# Patient Record
Sex: Female | Born: 1980 | Race: White | Hispanic: No | Marital: Single | Smoking: Current every day smoker
Health system: Southern US, Community
[De-identification: ages and names within clinical notes are randomized; demographics above are authoritative.]

## PROBLEM LIST (undated history)

## (undated) DIAGNOSIS — O1490 Unspecified pre-eclampsia, unspecified trimester: Secondary | ICD-10-CM

## (undated) DIAGNOSIS — F419 Anxiety disorder, unspecified: Secondary | ICD-10-CM

## (undated) DIAGNOSIS — H5213 Myopia, bilateral: Secondary | ICD-10-CM

## (undated) DIAGNOSIS — B182 Chronic viral hepatitis C: Secondary | ICD-10-CM

## (undated) DIAGNOSIS — J45909 Unspecified asthma, uncomplicated: Secondary | ICD-10-CM

## (undated) DIAGNOSIS — M5126 Other intervertebral disc displacement, lumbar region: Secondary | ICD-10-CM

## (undated) DIAGNOSIS — F1911 Other psychoactive substance abuse, in remission: Secondary | ICD-10-CM

## (undated) DIAGNOSIS — E0590 Thyrotoxicosis, unspecified without thyrotoxic crisis or storm: Secondary | ICD-10-CM

## (undated) HISTORY — DX: Myopia, bilateral: H52.13

## (undated) HISTORY — DX: Other intervertebral disc displacement, lumbar region: M51.26

## (undated) HISTORY — DX: Anxiety disorder, unspecified: F41.9

## (undated) HISTORY — DX: Other psychoactive substance abuse, in remission: F19.11

## (undated) HISTORY — DX: Chronic viral hepatitis C: B18.2

## (undated) HISTORY — DX: Thyrotoxicosis, unspecified without thyrotoxic crisis or storm: E05.90

## (undated) HISTORY — DX: Unspecified pre-eclampsia, unspecified trimester: O14.90

## (undated) HISTORY — DX: Unspecified asthma, uncomplicated: J45.909

---
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
## 2005-03-28 ENCOUNTER — Emergency Department: Payer: Self-pay | Admitting: Emergency Medicine

## 2006-03-02 ENCOUNTER — Emergency Department: Payer: Self-pay | Admitting: Emergency Medicine

## 2006-03-02 ENCOUNTER — Other Ambulatory Visit: Payer: Self-pay

## 2006-04-10 ENCOUNTER — Observation Stay: Payer: Self-pay | Admitting: Obstetrics and Gynecology

## 2006-08-09 ENCOUNTER — Observation Stay: Payer: Self-pay | Admitting: Obstetrics and Gynecology

## 2006-09-01 ENCOUNTER — Observation Stay: Payer: Self-pay | Admitting: Obstetrics and Gynecology

## 2006-09-02 ENCOUNTER — Inpatient Hospital Stay: Payer: Self-pay | Admitting: Obstetrics and Gynecology

## 2006-11-05 DIAGNOSIS — M5126 Other intervertebral disc displacement, lumbar region: Secondary | ICD-10-CM

## 2006-11-05 HISTORY — DX: Other intervertebral disc displacement, lumbar region: M51.26

## 2007-01-27 ENCOUNTER — Ambulatory Visit: Payer: Self-pay | Admitting: Specialist

## 2008-09-06 ENCOUNTER — Emergency Department: Payer: Self-pay | Admitting: Emergency Medicine

## 2008-11-05 HISTORY — PX: LAPAROSCOPIC CHOLECYSTECTOMY: SUR755

## 2009-02-25 ENCOUNTER — Encounter
Admission: RE | Admit: 2009-02-25 | Discharge: 2009-02-25 | Payer: Self-pay | Admitting: Physical Medicine & Rehabilitation

## 2009-03-19 ENCOUNTER — Emergency Department: Payer: Self-pay | Admitting: Emergency Medicine

## 2009-08-08 ENCOUNTER — Emergency Department: Payer: Self-pay | Admitting: Emergency Medicine

## 2009-10-30 ENCOUNTER — Emergency Department: Payer: Self-pay | Admitting: Unknown Physician Specialty

## 2009-11-06 ENCOUNTER — Emergency Department: Payer: Self-pay | Admitting: Unknown Physician Specialty

## 2009-12-16 ENCOUNTER — Ambulatory Visit: Payer: Self-pay | Admitting: Surgery

## 2009-12-19 ENCOUNTER — Ambulatory Visit: Payer: Self-pay | Admitting: Surgery

## 2010-01-03 ENCOUNTER — Ambulatory Visit: Payer: Self-pay | Admitting: Pain Medicine

## 2010-01-09 ENCOUNTER — Ambulatory Visit: Payer: Self-pay | Admitting: Pain Medicine

## 2010-01-26 ENCOUNTER — Ambulatory Visit: Payer: Self-pay | Admitting: Pain Medicine

## 2010-07-27 ENCOUNTER — Emergency Department (HOSPITAL_COMMUNITY): Admission: EM | Admit: 2010-07-27 | Discharge: 2010-07-27 | Payer: Self-pay | Admitting: Emergency Medicine

## 2010-07-31 ENCOUNTER — Emergency Department: Payer: Self-pay | Admitting: Internal Medicine

## 2010-09-24 ENCOUNTER — Emergency Department: Payer: Self-pay | Admitting: Emergency Medicine

## 2010-11-26 ENCOUNTER — Encounter: Payer: Self-pay | Admitting: Neurosurgery

## 2010-11-29 ENCOUNTER — Emergency Department: Payer: Self-pay | Admitting: Emergency Medicine

## 2011-01-18 LAB — POCT I-STAT, CHEM 8
Calcium, Ion: 1.15 mmol/L (ref 1.12–1.32)
Chloride: 102 mEq/L (ref 96–112)
Creatinine, Ser: 0.9 mg/dL (ref 0.4–1.2)
Glucose, Bld: 69 mg/dL — ABNORMAL LOW (ref 70–99)
HCT: 45 % (ref 36.0–46.0)
Sodium: 141 mEq/L (ref 135–145)

## 2011-01-18 LAB — RAPID URINE DRUG SCREEN, HOSP PERFORMED
Amphetamines: NOT DETECTED
Barbiturates: NOT DETECTED
Benzodiazepines: POSITIVE — AB
Opiates: POSITIVE — AB

## 2011-01-18 LAB — ETHANOL: Alcohol, Ethyl (B): <5 mg/dL (ref 0–10)

## 2013-06-30 ENCOUNTER — Emergency Department: Payer: Self-pay | Admitting: Emergency Medicine

## 2013-08-08 ENCOUNTER — Emergency Department: Payer: Self-pay | Admitting: Emergency Medicine

## 2013-08-25 ENCOUNTER — Emergency Department: Payer: Self-pay | Admitting: Emergency Medicine

## 2014-05-16 ENCOUNTER — Emergency Department: Payer: Self-pay | Admitting: Emergency Medicine

## 2014-12-06 ENCOUNTER — Emergency Department: Payer: Self-pay | Admitting: Student

## 2015-05-01 ENCOUNTER — Emergency Department
Admission: EM | Admit: 2015-05-01 | Discharge: 2015-05-01 | Disposition: A | Discharge status: Home / Self Care | Payer: Medicaid Other | Attending: Emergency Medicine | Admitting: Emergency Medicine

## 2015-05-01 ENCOUNTER — Emergency Department: Payer: Medicaid Other

## 2015-05-01 ENCOUNTER — Encounter: Payer: Self-pay | Admitting: Emergency Medicine

## 2015-05-01 DIAGNOSIS — L02512 Cutaneous abscess of left hand: Secondary | ICD-10-CM | POA: Insufficient documentation

## 2015-05-01 DIAGNOSIS — L0291 Cutaneous abscess, unspecified: Secondary | ICD-10-CM

## 2015-05-01 DIAGNOSIS — Z720 Tobacco use: Secondary | ICD-10-CM | POA: Insufficient documentation

## 2015-05-01 LAB — CBC
HCT: 47.1 % — ABNORMAL HIGH (ref 35.0–47.0)
Hemoglobin: 15.9 g/dL (ref 12.0–16.0)
MCH: 30.4 pg (ref 26.0–34.0)
MCHC: 33.7 g/dL (ref 32.0–36.0)
MCV: 90.1 fL (ref 80.0–100.0)
PLATELETS: 364 10*3/uL (ref 150–440)
RBC: 5.23 MIL/uL — ABNORMAL HIGH (ref 3.80–5.20)
RDW: 13.3 % (ref 11.5–14.5)
WBC: 9.8 10*3/uL (ref 3.6–11.0)

## 2015-05-01 LAB — COMPREHENSIVE METABOLIC PANEL
ALT: 9 U/L — AB (ref 14–54)
ANION GAP: 11 (ref 5–15)
AST: 16 U/L (ref 15–41)
Albumin: 3.5 g/dL (ref 3.5–5.0)
Alkaline Phosphatase: 80 U/L (ref 38–126)
BILIRUBIN TOTAL: 1.2 mg/dL (ref 0.3–1.2)
BUN: <5 mg/dL — ABNORMAL LOW (ref 6–20)
CALCIUM: 8.8 mg/dL — AB (ref 8.9–10.3)
CO2: 26 mmol/L (ref 22–32)
Chloride: 98 mmol/L — ABNORMAL LOW (ref 101–111)
Creatinine, Ser: 0.61 mg/dL (ref 0.44–1.00)
GFR calc Af Amer: >60 mL/min (ref >60)
GFR calc non Af Amer: >60 mL/min (ref >60)
GLUCOSE: 106 mg/dL — AB (ref 65–99)
POTASSIUM: 3.2 mmol/L — AB (ref 3.5–5.1)
Sodium: 135 mmol/L (ref 135–145)
Total Protein: 8 g/dL (ref 6.5–8.1)

## 2015-05-01 MED ORDER — LIDOCAINE-EPINEPHRINE 2 %-1:100000 IJ SOLN
20.0000 mL | Freq: Once | INTRAMUSCULAR | Status: AC
  Administered 2015-05-01: 20 mL via INTRADERMAL

## 2015-05-01 MED ORDER — DIPHENHYDRAMINE HCL 25 MG PO CAPS: 25.0000 mg | ORAL_CAPSULE | Freq: Once | ORAL | Status: DC

## 2015-05-01 MED ORDER — CLINDAMYCIN HCL 150 MG PO CAPS: 300.0000 mg | ORAL_CAPSULE | Freq: Once | ORAL | Status: DC

## 2015-05-01 MED ORDER — CLINDAMYCIN HCL 300 MG PO CAPS: 600.0000 mg | ORAL_CAPSULE | Freq: Three times a day (TID) | ORAL | Status: DC

## 2015-05-01 MED ORDER — DIPHENHYDRAMINE HCL 25 MG PO CAPS
ORAL_CAPSULE | ORAL | Status: AC
  Administered 2015-05-01: 25 mg
  Filled 2015-05-01: qty 1

## 2015-05-01 MED ORDER — CLINDAMYCIN HCL 150 MG PO CAPS
ORAL_CAPSULE | ORAL | Status: AC
  Administered 2015-05-01: 600 mg
  Filled 2015-05-01: qty 4

## 2015-05-01 MED ORDER — LIDOCAINE-EPINEPHRINE (PF) 1 %-1:200000 IJ SOLN
INTRAMUSCULAR | Status: AC
  Filled 2015-05-01: qty 30

## 2015-05-01 MED ORDER — VANCOMYCIN HCL IN DEXTROSE 1-5 GM/200ML-% IV SOLN
1000.0000 mg | Freq: Once | INTRAVENOUS | Status: AC
  Administered 2015-05-01: 1000 mg via INTRAVENOUS
  Filled 2015-05-01: qty 200

## 2015-05-01 NOTE — ED Notes (Signed)
Patient c/o significant itching with redness around IV site, patient also verbalizing that she can not sit and wait for antibiotic to infuse especially since it will take an hour. After consultation with MD order received for oral Benadryl and Clindamycin with instruction that she be very vigilant to have prescription filled and begin taking in the morning. Patient verbalized understanding as well as that she needs to return tomorrow for wound checl.

## 2015-05-01 NOTE — ED Notes (Signed)
Pt complains of abscess to left hand that started 3 days ago. Swelling noted to left hand. Pt states that site is draining and is complaining of pain.

## 2015-05-01 NOTE — ED Notes (Signed)
Patient verbalized need to follow up in ED tomorrow for recheck of left hand as well as obtain and start antibiotics as soon as possible in the morning.

## 2015-05-01 NOTE — ED Provider Notes (Signed)
[facility] Emergency Department Provider Note    ____________________________________________  Time seen: 1630  I have reviewed the triage vital signs and the nursing notes.   HISTORY  Chief Complaint Abscess   History limited by: Not Limited   HPI Penny Tucker is a 34 y.o. female who presents to the emergency department today because of concerns for abscess on left hand. She states it is been there for 3 days. She does admit to IV drug use at that site. She states that she has been poking it with a needle to try to start drainage. She states yesterday she felt like a "core" came out. She has had continued pain redness and swelling to the site. She denies any fevers, vomiting or diarrhea.   History reviewed. No pertinent past medical history.  There are no active problems to display for this patient.   History reviewed. No pertinent past surgical history.  No current outpatient prescriptions on file.  Allergies Review of patient's allergies indicates no known allergies.  History reviewed. No pertinent family history.  Social History History  Substance Use Topics  . Smoking status: Current Every Day Smoker -- 1.00 packs/day    Types: Cigarettes  . Smokeless tobacco: Not on file  . Alcohol Use: No    Review of Systems  Constitutional: Negative for fever. Cardiovascular: Negative for chest pain. Respiratory: Negative for shortness of breath. Gastrointestinal: Negative for abdominal pain, vomiting and diarrhea. Musculoskeletal: Left hand pain, swelling. Skin: Open and draining abscess to left hand. Neurological: Negative for headaches, focal weakness or numbness.   10-point ROS otherwise negative.  ____________________________________________   PHYSICAL EXAM:  VITAL SIGNS: ED Triage Vitals  Enc Vitals Group     BP 05/01/15 1557 109/73 mmHg     Pulse Rate 05/01/15 1557 104     Resp 05/01/15 1557 18     Temp 05/01/15 1557 98.4  F (36.9 C)     Temp Source 05/01/15 1557 Oral     SpO2 05/01/15 1557 97 %     Weight 05/01/15 1557 150 lb (68.04 kg)     Height 05/01/15 1557 5\' 8"  (1.727 m)     Head Cir --      Peak Flow --      Pain Score 05/01/15 1558 10   Constitutional: Alert and oriented. Well appearing and in no distress. Eyes: Conjunctivae are normal. PERRL. Normal extraocular movements. ENT   Head: Normocephalic and atraumatic.   Nose: No congestion/rhinnorhea.   Mouth/Throat: Mucous membranes are moist.   Neck: No stridor. Hematological/Lymphatic/Immunilogical: No cervical lymphadenopathy. Cardiovascular: Normal rate, regular rhythm.   Respiratory: Normal respiratory effort without tachypnea nor retractions.  Gastrointestinal: Soft and nontender. No distention.  Genitourinary: Deferred Musculoskeletal: Left hand with erythema and swelling. Roughly three-quarter centimeter open wound near base of first digit. Exam consistent with draining abscess. Neurologic:  Normal speech and language. No gross focal neurologic deficits are appreciated. Speech is normal.  Skin:  Draining abscess to left hand Psychiatric: Mood and affect are normal. Speech and behavior are normal. Patient exhibits appropriate insight and judgment.  ____________________________________________    LABS (pertinent positives/negatives)  Labs Reviewed  CBC - Abnormal; Notable for the following:    RBC 5.23 (*)    HCT 47.1 (*)    All other components within normal limits  COMPREHENSIVE METABOLIC PANEL - Abnormal; Notable for the following:    Potassium 3.2 (*)    Chloride 98 (*)    Glucose, Bld 106 (*)  BUN <5 (*)    Calcium 8.8 (*)    ALT 9 (*)    All other components within normal limits  CULTURE, BLOOD (ROUTINE X 2)  CULTURE, BLOOD (ROUTINE X 2)     ____________________________________________   EKG  None  ____________________________________________    RADIOLOGY  Left hand x-ray IMPRESSION: Soft  tissue edema about the dorsum of the hand. No soft tissue air or radiopaque foreign body. No bony destructive change. ____________________________________________   PROCEDURES  Procedure(s) performed: Incision and drainage, see procedure note(s).  Critical Care performed: No   Incision and Drainage of Abcess Anesthesia Local: 1% Lidocaine with Epi  Prep/Procedure: Skin Prep: Chlorahex Incised abscess with #11 blade Purulent discharge: small Probed to break up loculations Packed with 1/4" gauze Estimated blood loss: 1 ml  ____________________________________________   INITIAL IMPRESSION / ASSESSMENT AND PLAN / ED COURSE  Pertinent labs & imaging results that were available during my care of the patient were reviewed by me and considered in my medical decision making (see chart for details).  She here with concerns for abscess to left hand. Will obtain an x-ray to make sure no foreign bodies are in wound. Will likely need some incision and drainage.  Patient's wound was incised and drained a small amount of pus. Did write for IV antibiotics since patient stated she had concerns about obtaining prescription today. However shortly after vancomycin started infusing patient stated she was becoming itchy. Additionally patient voiced that she did not want to stay for an IV infusion. This will give dose of clindamycin. Did discuss with patient that I would like her to come back to the emergency department for reevaluation of her abscess. And to make sure that it appears to be healing. ____________________________________________   FINAL CLINICAL IMPRESSION(S) / ED DIAGNOSES  Final diagnoses:  Abscess     Phineas Semen, MD 05/01/15 [phone]

## 2015-05-01 NOTE — Discharge Instructions (Signed)
It is very important that you return to the emergency department tomorrow for a recheck of your abscess. Please seek medical attention for any high fevers, chest pain, shortness of breath, change in behavior, persistent vomiting, bloody stool or any other new or concerning symptoms.  Abscess An abscess is an infected area that contains a collection of pus and debris.It can occur in almost any part of the body. An abscess is also known as a furuncle or boil. CAUSES  An abscess occurs when tissue gets infected. This can occur from blockage of oil or sweat glands, infection of hair follicles, or a minor injury to the skin. As the body tries to fight the infection, pus collects in the area and creates pressure under the skin. This pressure causes pain. People with weakened immune systems have difficulty fighting infections and get certain abscesses more often.  SYMPTOMS Usually an abscess develops on the skin and becomes a painful mass that is red, warm, and tender. If the abscess forms under the skin, you may feel a moveable soft area under the skin. Some abscesses break open (rupture) on their own, but most will continue to get worse without care. The infection can spread deeper into the body and eventually into the bloodstream, causing you to feel ill.  DIAGNOSIS  Your caregiver will take your medical history and perform a physical exam. A sample of fluid may also be taken from the abscess to determine what is causing your infection. TREATMENT  Your caregiver may prescribe antibiotic medicines to fight the infection. However, taking antibiotics alone usually does not cure an abscess. Your caregiver may need to make a small cut (incision) in the abscess to drain the pus. In some cases, gauze is packed into the abscess to reduce pain and to continue draining the area. HOME CARE INSTRUCTIONS   Only take over-the-counter or prescription medicines for pain, discomfort, or fever as directed by your  caregiver.  If you were prescribed antibiotics, take them as directed. Finish them even if you start to feel better.  If gauze is used, follow your caregiver's directions for changing the gauze.  To avoid spreading the infection:  Keep your draining abscess covered with a bandage.  Wash your hands well.  Do not share personal care items, towels, or whirlpools with others.  Avoid skin contact with others.  Keep your skin and clothes clean around the abscess.  Keep all follow-up appointments as directed by your caregiver. SEEK MEDICAL CARE IF:   You have increased pain, swelling, redness, fluid drainage, or bleeding.  You have muscle aches, chills, or a general ill feeling.  You have a fever. MAKE SURE YOU:   Understand these instructions.  Will watch your condition.  Will get help right away if you are not doing well or get worse. Document Released: 08/01/2005 Document Revised: 04/22/2012 Document Reviewed: 01/04/2012 [facility] Patient Information 2015 [place], [place]. This information is not intended to replace advice given to you by your health care provider. Make sure you discuss any questions you have with your health care provider.

## 2015-05-07 LAB — CULTURE, BLOOD (ROUTINE X 2)
Culture: NO GROWTH
SPECIAL REQUESTS: NORMAL

## 2015-07-10 ENCOUNTER — Emergency Department
Admission: EM | Admit: 2015-07-10 | Discharge: 2015-07-11 | Disposition: A | Discharge status: Other Acute Inpatient Hospital | Payer: Medicaid Other | Attending: Emergency Medicine | Admitting: Emergency Medicine

## 2015-07-10 DIAGNOSIS — Z720 Tobacco use: Secondary | ICD-10-CM | POA: Insufficient documentation

## 2015-07-10 DIAGNOSIS — L03114 Cellulitis of left upper limb: Secondary | ICD-10-CM | POA: Insufficient documentation

## 2015-07-10 DIAGNOSIS — L02414 Cutaneous abscess of left upper limb: Secondary | ICD-10-CM | POA: Insufficient documentation

## 2015-07-10 MED ORDER — VANCOMYCIN HCL IN DEXTROSE 1-5 GM/200ML-% IV SOLN
1000.0000 mg | Freq: Once | INTRAVENOUS | Status: AC
  Administered 2015-07-10: 1000 mg via INTRAVENOUS
  Filled 2015-07-10: qty 200

## 2015-07-10 MED ORDER — MORPHINE SULFATE (PF) 4 MG/ML IV SOLN
4.0000 mg | Freq: Once | INTRAVENOUS | Status: AC
  Administered 2015-07-10: 4 mg via INTRAVENOUS
  Filled 2015-07-10: qty 1

## 2015-07-10 MED ORDER — ONDANSETRON HCL 4 MG/2ML IJ SOLN
4.0000 mg | Freq: Once | INTRAMUSCULAR | Status: AC
  Administered 2015-07-10: 4 mg via INTRAVENOUS
  Filled 2015-07-10: qty 2

## 2015-07-10 MED ORDER — SODIUM CHLORIDE 0.9 % IV BOLUS (SEPSIS)
1000.0000 mL | Freq: Once | INTRAVENOUS | Status: AC
  Administered 2015-07-10: 1000 mL via INTRAVENOUS

## 2015-07-10 NOTE — ED Notes (Signed)
Pt states that she shot up with cocaine a few days ago and missed. Pt states that approx 2 days later she started having severe pain, pt has a large amount of swelling to the left inner wrist, pt has swelling to the left forearm as well. Pt states that she poked it with a needle this am and was able to get some pus out

## 2015-07-11 LAB — BASIC METABOLIC PANEL
ANION GAP: 10 (ref 5–15)
BUN: 6 mg/dL (ref 6–20)
CALCIUM: 9.3 mg/dL (ref 8.9–10.3)
CO2: 26 mmol/L (ref 22–32)
Chloride: 99 mmol/L — ABNORMAL LOW (ref 101–111)
Creatinine, Ser: 0.65 mg/dL (ref 0.44–1.00)
GLUCOSE: 97 mg/dL (ref 65–99)
POTASSIUM: 3.2 mmol/L — AB (ref 3.5–5.1)
SODIUM: 135 mmol/L (ref 135–145)

## 2015-07-11 LAB — CBC
HCT: 42.7 % (ref 35.0–47.0)
Hemoglobin: 14.6 g/dL (ref 12.0–16.0)
MCH: 29.3 pg (ref 26.0–34.0)
MCHC: 34.1 g/dL (ref 32.0–36.0)
MCV: 85.8 fL (ref 80.0–100.0)
PLATELETS: 349 10*3/uL (ref 150–440)
RBC: 4.97 MIL/uL (ref 3.80–5.20)
RDW: 13.6 % (ref 11.5–14.5)
WBC: 14.2 10*3/uL — AB (ref 3.6–11.0)

## 2015-07-11 MED ORDER — MORPHINE SULFATE (PF) 4 MG/ML IV SOLN
4.0000 mg | Freq: Once | INTRAVENOUS | Status: AC
  Administered 2015-07-11: 4 mg via INTRAVENOUS

## 2015-07-11 MED ORDER — MORPHINE SULFATE (PF) 4 MG/ML IV SOLN
INTRAVENOUS | Status: AC
  Filled 2015-07-11: qty 1

## 2015-07-11 MED ORDER — HYDROMORPHONE HCL 1 MG/ML IJ SOLN
1.0000 mg | Freq: Once | INTRAMUSCULAR | Status: AC
  Administered 2015-07-11: 1 mg via INTRAVENOUS
  Filled 2015-07-11: qty 1

## 2015-07-11 MED ORDER — PIPERACILLIN-TAZOBACTAM 3.375 G IVPB
3.3750 g | Freq: Once | INTRAVENOUS | Status: DC
  Administered 2015-07-11: 3.375 g via INTRAVENOUS
  Filled 2015-07-11: qty 50

## 2015-07-11 NOTE — ED Notes (Signed)
Pt crying in pain. MD aware.

## 2015-07-11 NOTE — ED Provider Notes (Signed)
[facility] Emergency Department Provider Note  ____________________________________________  Time seen: Approximately 2335 PM  I have reviewed the triage vital signs and the nursing notes.   HISTORY  Chief Complaint Abscess    HPI Penny Tucker is a 34 y.o. female who reports that she shot up cocaine and heroin into her left wrist 3 days ago. The patient reports that she developed a large abscess to her left wrist. The patient reports that she poked it with a pin today and she saw some pus come out of it. The patient came in for evaluation and treatment of her abscess. The patient denies any fever but reports that she's had some nausea. The patient also reports that she has not eaten anything in the last 3 days. The patient reports that it hurts to move her fingers and she can only hold them in one position. The patient reports the pain as a 10 out of 10 in intensity.  Past medical history Ruptured disc     There are no active problems to display for this patient.   Past surgical history C-section Cholecystectomy  Current Outpatient Rx  Name  Route  Sig  Dispense  Refill  . clindamycin (CLEOCIN) 300 MG capsule   Oral   Take 2 capsules (600 mg total) by mouth 3 (three) times daily. Patient not taking: Reported on 07/10/2015   60 capsule   0     Allergies Review of patient's allergies indicates no known allergies.  No family history on file.  Social History Social History  Substance Use Topics  . Smoking status: Current Every Day Smoker -- 1.00 packs/day    Types: Cigarettes  . Smokeless tobacco: Not on file  . Alcohol Use: No    Review of Systems Constitutional: No fever/chills Eyes: No visual changes. ENT: No sore throat. Cardiovascular: Denies chest pain. Respiratory: Denies shortness of breath. Gastrointestinal: Nausea with No abdominal pain. no vomiting.  No diarrhea.  No constipation. Genitourinary: Negative for  dysuria. Musculoskeletal: Negative for back pain. Skin: Erythema and large abscess to left wrist Neurological: Negative for headaches, focal weakness or numbness.  10-point ROS otherwise negative.  ____________________________________________   PHYSICAL EXAM:  VITAL SIGNS: ED Triage Vitals  Enc Vitals Group     BP 07/10/15 2236 121/83 mmHg     Pulse Rate 07/10/15 2236 102     Resp 07/10/15 2236 20     Temp 07/10/15 2236 99 F (37.2 C)     Temp Source 07/10/15 2236 Oral     SpO2 07/10/15 2236 99 %     Weight --      Height 07/10/15 2236  (1.702 m)     Head Cir --      Peak Flow --      Pain Score 07/10/15 2244 10     Pain Loc --      Pain Edu? --      Excl. in GC? --     Constitutional: Alert and oriented. Disheveled appearing and in moderate distress. Eyes: Conjunctivae are normal. PERRL. EOMI. Head: Atraumatic. Nose: No congestion/rhinnorhea. Mouth/Throat: Mucous membranes are moist.  Oropharynx non-erythematous. Cardiovascular: Normal rate, regular rhythm. Grossly normal heart sounds.  Good peripheral circulation. Respiratory: Normal respiratory effort.  No retractions. Lungs CTAB. Gastrointestinal: Soft and nontender. No distention. Positive bowel sounds Musculoskeletal: No lower extremity tenderness nor edema.  Neurologic:  Normal speech and language.  Skin:  Erythema to left wrist with large abscess on left wrist.  Approximately 7 cm x 5 cm x 4 cm Psychiatric: Mood and affect are normal.   ____________________________________________   LABS (all labs ordered are listed, but only abnormal results are displayed)  Labs Reviewed  CBC - Abnormal; Notable for the following:    WBC 14.2 (*)    All other components within normal limits  BASIC METABOLIC PANEL - Abnormal; Notable for the following:    Potassium 3.2 (*)    Chloride 99 (*)    All other components within normal limits  CULTURE, BLOOD (ROUTINE X 2)  CULTURE, BLOOD (ROUTINE X 2)    ____________________________________________  EKG  None ____________________________________________  RADIOLOGY  None ____________________________________________   PROCEDURES  Procedure(s) performed: None  Critical Care performed: No  ____________________________________________   INITIAL IMPRESSION / ASSESSMENT AND PLAN / ED COURSE  Pertinent labs & imaging results that were available during my care of the patient were reviewed by me and considered in my medical decision making (see chart for details).  This is a 34 year old female who comes in with a large abscess to her left wrist with erythema after shooting up heroin and cocaine. The patient has a large area of cellulitis to her wrist as well. Given the history of IV drug abuse I'm concerned that there may be a possible pseudoaneurysm and blood mixed in this large abscess. I will contact surgery to have her evaluated for surgical incision drainage and washout of the wound. I will give the patient dose of vancomycin 1 g IV 1 dose. The patient also received morphine and Zofran and a liter of normal saline.  The patient received a subsequent dose of morphine and Dilaudid for her pain. I also added IV Zosyn to the patient's antibiotics. The patient will be transferred to [facility]. I initially spoke to our surgeon as well as her orthopedic surgeon and they felt that given the extent of the infection that the wound should be evaluated by a hand surgeon. I spoke to the plastic surgeon at [facility] and they recommended transfer to the emergency department. The patient will be transferred to [facility] for incision and drainage of her abscess. ____________________________________________   FINAL CLINICAL IMPRESSION(S) / ED DIAGNOSES  Final diagnoses:  Abscess of arm, left  Cellulitis of left upper extremity      Rebecka Apley, MD 07/11/15 [phone]

## 2015-07-16 LAB — CULTURE, BLOOD (ROUTINE X 2)
CULTURE: NO GROWTH
CULTURE: NO GROWTH

## 2015-07-18 ENCOUNTER — Emergency Department
Admission: EM | Admit: 2015-07-18 | Discharge: 2015-07-18 | Disposition: A | Discharge status: Home / Self Care | Payer: Medicaid Other

## 2015-09-24 ENCOUNTER — Emergency Department
Admission: EM | Admit: 2015-09-24 | Discharge: 2015-09-24 | Disposition: A | Discharge status: Home / Self Care | Payer: Self-pay | Attending: Emergency Medicine | Admitting: Emergency Medicine

## 2015-09-24 ENCOUNTER — Encounter: Payer: Self-pay | Admitting: Emergency Medicine

## 2015-09-24 DIAGNOSIS — L03115 Cellulitis of right lower limb: Secondary | ICD-10-CM | POA: Insufficient documentation

## 2015-09-24 DIAGNOSIS — F17210 Nicotine dependence, cigarettes, uncomplicated: Secondary | ICD-10-CM | POA: Insufficient documentation

## 2015-09-24 LAB — COMPREHENSIVE METABOLIC PANEL
ALBUMIN: 3.5 g/dL (ref 3.5–5.0)
ALT: 33 U/L (ref 14–54)
ANION GAP: 10 (ref 5–15)
AST: 35 U/L (ref 15–41)
Alkaline Phosphatase: 105 U/L (ref 38–126)
BILIRUBIN TOTAL: 1.7 mg/dL — AB (ref 0.3–1.2)
BUN: 6 mg/dL (ref 6–20)
CALCIUM: 9.1 mg/dL (ref 8.9–10.3)
CO2: 25 mmol/L (ref 22–32)
Chloride: 100 mmol/L — ABNORMAL LOW (ref 101–111)
Creatinine, Ser: 0.76 mg/dL (ref 0.44–1.00)
GFR calc non Af Amer: >60 mL/min (ref >60)
GLUCOSE: 163 mg/dL — AB (ref 65–99)
POTASSIUM: 3.6 mmol/L (ref 3.5–5.1)
SODIUM: 135 mmol/L (ref 135–145)
TOTAL PROTEIN: 7.6 g/dL (ref 6.5–8.1)

## 2015-09-24 LAB — CBC
HEMATOCRIT: 42.6 % (ref 35.0–47.0)
HEMOGLOBIN: 14.6 g/dL (ref 12.0–16.0)
MCH: 30.3 pg (ref 26.0–34.0)
MCHC: 34.3 g/dL (ref 32.0–36.0)
MCV: 88.4 fL (ref 80.0–100.0)
Platelets: 373 10*3/uL (ref 150–440)
RBC: 4.82 MIL/uL (ref 3.80–5.20)
RDW: 13.7 % (ref 11.5–14.5)
WBC: 15.8 10*3/uL — ABNORMAL HIGH (ref 3.6–11.0)

## 2015-09-24 MED ORDER — KETOROLAC TROMETHAMINE 30 MG/ML IJ SOLN
INTRAMUSCULAR | Status: AC
  Filled 2015-09-24: qty 1

## 2015-09-24 MED ORDER — KETOROLAC TROMETHAMINE 30 MG/ML IJ SOLN
30.0000 mg | Freq: Once | INTRAMUSCULAR | Status: AC
  Administered 2015-09-24: 30 mg via INTRAVENOUS

## 2015-09-24 MED ORDER — CLINDAMYCIN HCL 300 MG PO CAPS: 300.0000 mg | ORAL_CAPSULE | Freq: Three times a day (TID) | ORAL | Status: DC

## 2015-09-24 NOTE — ED Provider Notes (Signed)
[facility] Emergency Department Provider Note    ____________________________________________  Time seen: 1815  I have reviewed the triage vital signs and the nursing notes.   HISTORY  Chief Complaint Cellulitis   History limited by: Not Limited   HPI Penny Tucker is a 34 y.o. female who presents to the emergency department today with concerns for right ankle swelling and pain. The patient states that she pulled out a quarter of a scab 2 or 3 days ago and started having more swelling and pain to that area. She states that the redness has extended over the right lower leg. She states that the pain is severe. She denies any fevers, nausea or vomiting.  History reviewed. No pertinent past medical history.  There are no active problems to display for this patient.   Past Surgical History  Procedure Laterality Date  . Cholecystectomy    . Cesarean section      Current Outpatient Rx  Name  Route  Sig  Dispense  Refill  . clindamycin (CLEOCIN) 300 MG capsule   Oral   Take 2 capsules (600 mg total) by mouth 3 (three) times daily. Patient not taking: Reported on 07/10/2015   60 capsule   0     Allergies Review of patient's allergies indicates no known allergies.  No family history on file.  Social History Social History  Substance Use Topics  . Smoking status: Current Every Day Smoker -- 1.00 packs/day    Types: Cigarettes  . Smokeless tobacco: None  . Alcohol Use: No    Review of Systems  Constitutional: Negative for fever. Cardiovascular: Negative for chest pain. Respiratory: Negative for shortness of breath. Gastrointestinal: Negative for abdominal pain, vomiting and diarrhea. Skin: redness, pain around right lower leg and ankle Neurological: Negative for headaches, focal weakness or numbness.  10-point ROS otherwise negative.  ____________________________________________   PHYSICAL EXAM:  VITAL SIGNS: ED Triage Vitals   Enc Vitals Group     BP 09/24/15 1447 93/62 mmHg     Pulse Rate 09/24/15 1447 100     Resp 09/24/15 1810 17     Temp 09/24/15 1447 98.4 F (36.9 C)     Temp Source 09/24/15 1447 Oral     SpO2 09/24/15 1447 96 %     Weight 09/24/15 1447 150 lb (68.04 kg)     Height 09/24/15 1447  (1.727 m)     Head Cir --      Peak Flow --      Pain Score 09/24/15 1448 10   Constitutional: Alert and oriented. Well appearing and in no distress. Eyes: Conjunctivae are normal. PERRL. Normal extraocular movements. ENT   Head: Normocephalic and atraumatic.   Nose: No congestion/rhinnorhea.   Mouth/Throat: Mucous membranes are moist.   Neck: No stridor. Hematological/Lymphatic/Immunilogical: No cervical lymphadenopathy. Cardiovascular: Normal rate, regular rhythm.   Respiratory: Normal respiratory effort without tachypnea nor retractions.  Musculoskeletal: painless dorsi and plantar flexion of the right ankle. Neurologic:  Normal speech and language. No gross focal neurologic deficits are appreciated.  Skin:  Scab overlying the area of the right lateral malleolus. Erythema extending to the mid foot and mid shin. No fluctuance appreciated. Tenderness to palpation. Ultrasound was performed over the area and showed some cobblestoning of the subcutaneous tissue however no collection or pocket of fluid.  ____________________________________________    LABS (pertinent positives/negatives)  Labs Reviewed  COMPREHENSIVE METABOLIC PANEL - Abnormal; Notable for the following:    Chloride 100 (*)  Glucose, Bld 163 (*)    Total Bilirubin 1.7 (*)    All other components within normal limits  CBC - Abnormal; Notable for the following:    WBC 15.8 (*)    All other components within normal limits     ____________________________________________   EKG  None  ____________________________________________     RADIOLOGY  None   ____________________________________________   PROCEDURES  Procedure(s) performed: None  Critical Care performed: No  ____________________________________________   INITIAL IMPRESSION / ASSESSMENT AND PLAN / ED COURSE  Pertinent labs & imaging results that were available during my care of the patient were reviewed by me and considered in my medical decision making (see chart for details).  Patient presented to the emergency department today because of concerns for area of redness and pain around her right ankle. On exam patient certainly does have cellulitis of that right lower leg. I do not feel any abscess nor was there any evidence of a true collection of pus on ultrasound. At this point I doubt that the ankle joint itself is infected given that I was able to dorsi and plantar flex at the ankle without any pain and furthermore there was no swelling anywhere except the skin overlying the lateral malleolus. Additionally the patient has not had any fevers. At this point I will plan on treating with oral antibiotics.patient did discuss her concern about being unable to afford her antibiotics so I did provide the patient with the alamap application and information.  ____________________________________________   FINAL CLINICAL IMPRESSION(S) / ED DIAGNOSES  Final diagnoses:  Cellulitis of right lower extremity     Phineas SemenGraydon Caragh Gasper, MD 09/24/15 1943

## 2015-09-24 NOTE — ED Notes (Addendum)
Pt yelling at doctor on my arrival. Dr trying to exam the possible abscess on rt ankle. Pt screaming and yelling that she wants pain medicines. Ripped out her rt iv and threw it on the bed, dripping blood. Wiped her wrist on the bed - gave her a 2x2. Dr trying to explain the plan of care and pt just rolling on the bed cussing and crying with no tears. Pt saying she is going to leave - advised that she needs treatment.

## 2015-09-24 NOTE — ED Notes (Signed)
Patient states she cut herself shaving approx 1 year ago (to right lower leg). Patient states since then has had scab present with "core" that she pulled out 2-3 days ago. Since pulling scab off, has had redness, swelling, and warmth to right lower extremity. Has pen marks where she measured redness from 2 days ago, redness has increased significantly. Denies any known fevers.

## 2015-09-24 NOTE — Discharge Instructions (Signed)
Please seek medical attention for any high fevers, chest pain, shortness of breath, change in behavior, persistent vomiting, bloody stool or any other new or concerning symptoms. ° ° °Cellulitis °Cellulitis is an infection of the skin and the tissue beneath it. The infected area is usually red and tender. Cellulitis occurs most often in the arms and lower legs.  °CAUSES  °Cellulitis is caused by bacteria that enter the skin through cracks or cuts in the skin. The most common types of bacteria that cause cellulitis are staphylococci and streptococci. °SIGNS AND SYMPTOMS  °· Redness and warmth. °· Swelling. °· Tenderness or pain. °· Fever. °DIAGNOSIS  °Your health care provider can usually determine what is wrong based on a physical exam. Blood tests may also be done. °TREATMENT  °Treatment usually involves taking an antibiotic medicine. °HOME CARE INSTRUCTIONS  °· Take your antibiotic medicine as directed by your health care provider. Finish the antibiotic even if you start to feel better. °· Keep the infected arm or leg elevated to reduce swelling. °· Apply a warm cloth to the affected area up to 4 times per day to relieve pain. °· Take medicines only as directed by your health care provider. °· Keep all follow-up visits as directed by your health care provider. °SEEK MEDICAL CARE IF:  °· You notice red streaks coming from the infected area. °· Your red area gets larger or turns dark in color. °· Your bone or joint underneath the infected area becomes painful after the skin has healed. °· Your infection returns in the same area or another area. °· You notice a swollen bump in the infected area. °· You develop new symptoms. °· You have a fever. °SEEK IMMEDIATE MEDICAL CARE IF:  °· You feel very sleepy. °· You develop vomiting or diarrhea. °· You have a general ill feeling (malaise) with muscle aches and pains. °  °This information is not intended to replace advice given to you by your health care provider. Make sure  you discuss any questions you have with your health care provider. °  °Document Released: 08/01/2005 Document Revised: 07/13/2015 Document Reviewed: 01/07/2012 °[employer] Interactive Patient Education ©2016 [employer] ° °

## 2015-09-24 NOTE — ED Notes (Signed)
Pt requested crutches to walk with at home, but unable to do a return demonstration on and not listening to directions on how to use - and in doing so almost fell. Pt not given crutches and explanation as to why but still refusing to cooperate. Given discharge instructions but refusing to sign for them. Given instructions, rx, discount rx information and follow up.

## 2015-09-24 NOTE — ED Notes (Signed)
Pt told she needed oral antibiotics - yelling that she has no money. Offered rx card for 75% off (the ones from [employer]) and doctor also offered her info on programs for help. Pt states understanding that she needs antibiotic but states she needs pain meds.

## 2015-09-30 ENCOUNTER — Encounter: Payer: Self-pay | Admitting: *Deleted

## 2015-09-30 ENCOUNTER — Emergency Department: Payer: Medicaid Other

## 2015-09-30 ENCOUNTER — Inpatient Hospital Stay
Admission: EM | Admit: 2015-09-30 | Discharge: 2015-10-04 | DRG: 603 | Disposition: A | Discharge status: Home / Self Care | Payer: Self-pay | Attending: Internal Medicine | Admitting: Internal Medicine

## 2015-09-30 DIAGNOSIS — F17200 Nicotine dependence, unspecified, uncomplicated: Secondary | ICD-10-CM | POA: Diagnosis present

## 2015-09-30 DIAGNOSIS — W458XXA Other foreign body or object entering through skin, initial encounter: Secondary | ICD-10-CM | POA: Diagnosis present

## 2015-09-30 DIAGNOSIS — Y93E8 Activity, other personal hygiene: Secondary | ICD-10-CM

## 2015-09-30 DIAGNOSIS — M71071 Abscess of bursa, right ankle and foot: Secondary | ICD-10-CM

## 2015-09-30 DIAGNOSIS — E876 Hypokalemia: Secondary | ICD-10-CM | POA: Diagnosis present

## 2015-09-30 DIAGNOSIS — F1410 Cocaine abuse, uncomplicated: Secondary | ICD-10-CM | POA: Diagnosis present

## 2015-09-30 DIAGNOSIS — Z9049 Acquired absence of other specified parts of digestive tract: Secondary | ICD-10-CM

## 2015-09-30 DIAGNOSIS — F1910 Other psychoactive substance abuse, uncomplicated: Secondary | ICD-10-CM | POA: Diagnosis present

## 2015-09-30 DIAGNOSIS — L03115 Cellulitis of right lower limb: Principal | ICD-10-CM | POA: Diagnosis present

## 2015-09-30 DIAGNOSIS — L02419 Cutaneous abscess of limb, unspecified: Secondary | ICD-10-CM | POA: Diagnosis present

## 2015-09-30 LAB — URINE DRUG SCREEN, QUALITATIVE (ARMC ONLY)
Amphetamines, Ur Screen: NOT DETECTED
Barbiturates, Ur Screen: NOT DETECTED
Benzodiazepine, Ur Scrn: NOT DETECTED
CANNABINOID 50 NG, UR ~~LOC~~: NOT DETECTED
COCAINE METABOLITE, UR ~~LOC~~: POSITIVE — AB
MDMA (ECSTASY) UR SCREEN: NOT DETECTED
Methadone Scn, Ur: NOT DETECTED
OPIATE, UR SCREEN: POSITIVE — AB
PHENCYCLIDINE (PCP) UR S: NOT DETECTED
Tricyclic, Ur Screen: NOT DETECTED

## 2015-09-30 LAB — CBC
HEMATOCRIT: 39 % (ref 35.0–47.0)
Hemoglobin: 13.3 g/dL (ref 12.0–16.0)
MCH: 30 pg (ref 26.0–34.0)
MCHC: 34.2 g/dL (ref 32.0–36.0)
MCV: 87.7 fL (ref 80.0–100.0)
Platelets: 457 10*3/uL — ABNORMAL HIGH (ref 150–440)
RBC: 4.45 MIL/uL (ref 3.80–5.20)
RDW: 13.4 % (ref 11.5–14.5)
WBC: 18.3 10*3/uL — AB (ref 3.6–11.0)

## 2015-09-30 LAB — BASIC METABOLIC PANEL
ANION GAP: 8 (ref 5–15)
BUN: <5 mg/dL — ABNORMAL LOW (ref 6–20)
CALCIUM: 9 mg/dL (ref 8.9–10.3)
CO2: 28 mmol/L (ref 22–32)
Chloride: 100 mmol/L — ABNORMAL LOW (ref 101–111)
Creatinine, Ser: 0.54 mg/dL (ref 0.44–1.00)
GFR calc Af Amer: >60 mL/min (ref >60)
GFR calc non Af Amer: >60 mL/min (ref >60)
GLUCOSE: 125 mg/dL — AB (ref 65–99)
Potassium: 2.9 mmol/L — CL (ref 3.5–5.1)
Sodium: 136 mmol/L (ref 135–145)

## 2015-09-30 MED ORDER — ACETAMINOPHEN 325 MG PO TABS: 650.0000 mg | ORAL_TABLET | Freq: Four times a day (QID) | ORAL | Status: DC | PRN

## 2015-09-30 MED ORDER — SENNOSIDES-DOCUSATE SODIUM 8.6-50 MG PO TABS: 1.0000 | ORAL_TABLET | Freq: Every evening | ORAL | Status: DC | PRN

## 2015-09-30 MED ORDER — VANCOMYCIN HCL IN DEXTROSE 1-5 GM/200ML-% IV SOLN
1000.0000 mg | Freq: Once | INTRAVENOUS | Status: AC
Start: 2015-09-30 — End: 2015-09-30
  Administered 2015-09-30: 1000 mg via INTRAVENOUS
  Filled 2015-09-30: qty 200

## 2015-09-30 MED ORDER — SODIUM CHLORIDE 0.9 % IV SOLN: 250.0000 mL | INTRAVENOUS | Status: DC | PRN

## 2015-09-30 MED ORDER — SODIUM CHLORIDE 0.9 % IV BOLUS (SEPSIS)
1000.0000 mL | Freq: Once | INTRAVENOUS | Status: AC
  Administered 2015-09-30: 1000 mL via INTRAVENOUS

## 2015-09-30 MED ORDER — SODIUM CHLORIDE 0.9 % IJ SOLN
3.0000 mL | Freq: Two times a day (BID) | INTRAMUSCULAR | Status: DC
  Administered 2015-09-30 – 2015-10-04 (×7): 3 mL via INTRAVENOUS

## 2015-09-30 MED ORDER — MORPHINE SULFATE (PF) 2 MG/ML IV SOLN
2.0000 mg | INTRAVENOUS | Status: DC | PRN
  Administered 2015-09-30 (×2): 2 mg via INTRAVENOUS
  Filled 2015-09-30 (×2): qty 1

## 2015-09-30 MED ORDER — ONDANSETRON HCL 4 MG/2ML IJ SOLN
4.0000 mg | Freq: Four times a day (QID) | INTRAMUSCULAR | Status: DC | PRN
  Administered 2015-10-02: 4 mg via INTRAVENOUS

## 2015-09-30 MED ORDER — LIDOCAINE HCL (PF) 1 % IJ SOLN
INTRAMUSCULAR | Status: AC
  Administered 2015-09-30: 10 mL via INTRADERMAL
  Filled 2015-09-30: qty 10

## 2015-09-30 MED ORDER — SODIUM CHLORIDE 0.9 % IJ SOLN: 3.0000 mL | INTRAMUSCULAR | Status: DC | PRN

## 2015-09-30 MED ORDER — VANCOMYCIN HCL IN DEXTROSE 1-5 GM/200ML-% IV SOLN: 1000.0000 mg | Freq: Once | INTRAVENOUS | Status: DC

## 2015-09-30 MED ORDER — HYDROMORPHONE HCL 1 MG/ML IJ SOLN
INTRAMUSCULAR | Status: AC
  Administered 2015-09-30: 1 mg via INTRAVENOUS
  Filled 2015-09-30: qty 1

## 2015-09-30 MED ORDER — LIDOCAINE HCL (PF) 1 % IJ SOLN
10.0000 mL | Freq: Once | INTRAMUSCULAR | Status: AC
  Administered 2015-09-30: 10 mL via INTRADERMAL

## 2015-09-30 MED ORDER — ENOXAPARIN SODIUM 40 MG/0.4ML ~~LOC~~ SOLN
40.0000 mg | Freq: Every day | SUBCUTANEOUS | Status: DC
  Filled 2015-09-30: qty 0.4

## 2015-09-30 MED ORDER — VANCOMYCIN HCL IN DEXTROSE 750-5 MG/150ML-% IV SOLN
750.0000 mg | Freq: Three times a day (TID) | INTRAVENOUS | Status: DC
  Administered 2015-09-30 – 2015-10-02 (×7): 750 mg via INTRAVENOUS
  Filled 2015-09-30 (×11): qty 150

## 2015-09-30 MED ORDER — ACETAMINOPHEN 650 MG RE SUPP: 650.0000 mg | Freq: Four times a day (QID) | RECTAL | Status: DC | PRN

## 2015-09-30 MED ORDER — ONDANSETRON HCL 4 MG PO TABS: 4.0000 mg | ORAL_TABLET | Freq: Four times a day (QID) | ORAL | Status: DC | PRN

## 2015-09-30 MED ORDER — PIPERACILLIN-TAZOBACTAM 3.375 G IVPB
3.3750 g | Freq: Once | INTRAVENOUS | Status: AC
  Administered 2015-09-30: 3.375 g via INTRAVENOUS
  Filled 2015-09-30: qty 50

## 2015-09-30 MED ORDER — POTASSIUM CHLORIDE 10 MEQ/100ML IV SOLN
10.0000 meq | INTRAVENOUS | Status: AC
  Administered 2015-09-30: 10 meq via INTRAVENOUS
  Filled 2015-09-30 (×2): qty 100

## 2015-09-30 MED ORDER — ALUM & MAG HYDROXIDE-SIMETH 200-200-20 MG/5ML PO SUSP: 30.0000 mL | Freq: Four times a day (QID) | ORAL | Status: DC | PRN

## 2015-09-30 MED ORDER — POTASSIUM CHLORIDE CRYS ER 20 MEQ PO TBCR
40.0000 meq | EXTENDED_RELEASE_TABLET | Freq: Once | ORAL | Status: AC
  Administered 2015-09-30: 40 meq via ORAL
  Filled 2015-09-30: qty 2

## 2015-09-30 MED ORDER — HYDROMORPHONE HCL 1 MG/ML IJ SOLN
1.0000 mg | Freq: Once | INTRAMUSCULAR | Status: AC
  Administered 2015-09-30: 1 mg via INTRAVENOUS

## 2015-09-30 MED ORDER — HYDROCODONE-ACETAMINOPHEN 5-325 MG PO TABS
1.0000 | ORAL_TABLET | ORAL | Status: DC | PRN
  Administered 2015-09-30: 1 via ORAL
  Administered 2015-09-30: 2 via ORAL
  Administered 2015-09-30: 1 via ORAL
  Administered 2015-09-30 – 2015-10-02 (×6): 2 via ORAL
  Filled 2015-09-30: qty 2
  Filled 2015-09-30: qty 1
  Filled 2015-09-30 (×2): qty 2
  Filled 2015-09-30: qty 1
  Filled 2015-09-30 (×4): qty 2

## 2015-09-30 MED ORDER — MORPHINE SULFATE (PF) 4 MG/ML IV SOLN
3.0000 mg | INTRAVENOUS | Status: DC | PRN
  Administered 2015-09-30 – 2015-10-01 (×7): 3 mg via INTRAVENOUS
  Filled 2015-09-30 (×7): qty 1

## 2015-09-30 MED ORDER — ONDANSETRON HCL 4 MG/2ML IJ SOLN
4.0000 mg | Freq: Once | INTRAMUSCULAR | Status: AC
  Administered 2015-09-30: 4 mg via INTRAVENOUS
  Filled 2015-09-30: qty 2

## 2015-09-30 MED ORDER — MORPHINE SULFATE (PF) 4 MG/ML IV SOLN
4.0000 mg | Freq: Once | INTRAVENOUS | Status: AC
  Administered 2015-09-30: 4 mg via INTRAVENOUS
  Filled 2015-09-30: qty 1

## 2015-09-30 NOTE — Care Management Note (Addendum)
Case Management Note  Patient Details  Name: Penny Tucker MRN: [id number] Date of Birth: 1981/09/03  Subjective/Objective:  34yo Penny Tucker was admitted 09/30/15 with right ankle cellulitis. Penny Tucker reports that she has not seen a physician but that her Medicaid assigned physician is [facility]. Pharmacy=no preference. Resides at home with her Mother and her child. Has no home assistive equipment and no home oxygen. Mother provides transportation. Currently receiving no home health services. Penny Tucker responded to direct questions but did not volunteer any addition information. Minimal eye contact. Affect blunted. On 05/01/15 Penny Tucker presented to the [facility]ED per a left hand abscess. On 07/10/15 she presented to the [facility]ED with left wrist and forearm swelling after admitted IV cocaine use. On 09/24/15 ahe presented to the [facility] with right lower leg swelling. A social work consult was made per drug use. Case management will follow for discharge planning.                   Action/Plan:   Expected Discharge Date:  10/03/15               Expected Discharge Plan:     In-House Referral:     Discharge planning Services     Post Acute Care Choice:    Choice offered to:     DME Arranged:    DME Agency:     HH Arranged:    HH Agency:     Status of Service:     Medicare Important Message Given:    Date Medicare IM Given:    Medicare IM give by:    Date Additional Medicare IM Given:    Additional Medicare Important Message give by:     If discussed at Long Length of Stay Meetings, dates discussed:    Additional Comments:  Milliani Herrada A, RN 09/30/2015, 11:58 AM

## 2015-09-30 NOTE — Progress Notes (Signed)
ANTIBIOTIC CONSULT NOTE - INITIAL  Pharmacy Consult for vancomycin Indication: RLE cellulitis with abscess  No Known Allergies  Patient Measurements: Height: 5\' 8"  (172.7 cm) Weight: 150 lb (68.04 kg) IBW/kg (Calculated) : 63.9 Adjusted Body Weight: 65.5 kg  Vital Signs: Temp: 98.4 F (36.9 C) (11/25 0020) Temp Source: Oral (11/25 0020) BP: 93/52 mmHg (11/25 0430) Pulse Rate: 78 (11/25 0430) Intake/Output from previous day:   Intake/Output from this shift:    Labs:  Recent Labs  09/30/15 0135  WBC 18.3*  HGB 13.3  PLT 457*  CREATININE 0.54   Estimated Creatinine Clearance: 100.9 mL/min (by C-G formula based on Cr of 0.54). No results for input(s): VANCOTROUGH, VANCOPEAK, VANCORANDOM, GENTTROUGH, GENTPEAK, GENTRANDOM, TOBRATROUGH, TOBRAPEAK, TOBRARND, AMIKACINPEAK, AMIKACINTROU, AMIKACIN in the last 72 hours.   Microbiology: No results found for this or any previous visit (from the past 720 hour(s)).  Medical History: Past Medical History  Diagnosis Date  . Drug use     Medications:  Infusions:  . potassium chloride 10 mEq (09/30/15 0400)   Assessment: 34 yof cc abcess and RLE pain. Cut herself shaving approx 1 year ago and it never healed well. [facility] and [facility] with [facility]indamycin; also taking amoxicillin but hasn't helped. Starting IV abx and admitting for failed OP abx.   Vd 45.9 L, Ke 0.09 hr-1, T1/2 7.7 hr  Goal of Therapy:  Vancomycin trough level 15-20 mcg/ml  Plan:  Expected duration 5 days with resolution of temperature and/or normalization of WBC. Vanomycin 1 gm IV x 1 given in ED, will follow with 750 mg IV Q8H, predicted trough 16 mcg/mL. Pharmacy will continue to follow and adjust dose as needed to maintain trough 15 to 20 mcg/mL.  Carola FrostNathan A Dheeraj Hail, Pharm.D., BCPS Clinical Pharmacist  09/30/2015,4:48 AM

## 2015-09-30 NOTE — ED Provider Notes (Signed)
[facility] Emergency Department Provider Note  ____________________________________________  Time seen: Approximately 0049 AM  I have reviewed the triage vital signs and the nursing notes.   HISTORY  Chief Complaint Abscess    HPI Penny Tucker is a 34 y.o. female who comes into the hospital today with an abscess to her right ankle. The patient reports she cut herself shaving a year ago and it never healed well. She reports that one week ago she picked the scab and it became infected. The patient came into the hospital she reports last week Saturday and was told there was no fluid collection in her ankle and placed on clindamycin. The patient reports that the area has swollen and become more painful and more tender in the time. The [facility] has [facility] to her wrist in the past but she reports that she has not been using any IV drugs to cause this abscess. The patient reports that she is unable to walk on her right ankle due to pain. The patient denies any fevers but reports her pain as a 10 out of 10 in intensity. He reports that she is unable to sleep so she decided to come back in for evaluation. The patient reports that she's had some dizziness but no nausea or vomiting no chest pain shortness of breath or headache. The patient reports that she is also been taking amoxicillin but it has not been helping.   Past Medical History  Diagnosis Date  . Drug use     There are no active problems to display for this patient.   Past Surgical History  Procedure Laterality Date  . Cholecystectomy    . Cesarean section      Current Outpatient Rx  Name  Route  Sig  Dispense  Refill  . clindamycin (CLEOCIN) 300 MG capsule   Oral   Take 1 capsule (300 mg total) by mouth 3 (three) times daily.   30 capsule   0     Allergies Review of patient's allergies indicates no known allergies.  No family history on file.  Social History Social  History  Substance Use Topics  . Smoking status: Current Every Day Smoker -- 1.00 packs/day    Types: Cigarettes  . Smokeless tobacco: None  . Alcohol Use: No    Review of Systems Constitutional: No fever/chills Eyes: No visual changes. ENT: No sore throat. Cardiovascular: Denies chest pain. Respiratory: Denies shortness of breath. Gastrointestinal: No abdominal pain.  No nausea, no vomiting.  No diarrhea.  No constipation. Genitourinary: Negative for dysuria. Musculoskeletal: Negative for back pain. Skin: Redness and abscess to right ankle Neurological: Dizziness  10-point ROS otherwise negative.  ____________________________________________   PHYSICAL EXAM:  VITAL SIGNS: ED Triage Vitals  Enc Vitals Group     BP 09/30/15 0020 136/93 mmHg     Pulse Rate 09/30/15 0020 118     Resp 09/30/15 0020 20     Temp 09/30/15 0020 98.4 F (36.9 C)     Temp Source 09/30/15 0020 Oral     SpO2 09/30/15 0020 98 %     Weight 09/30/15 0020 150 lb (68.04 kg)     Height 09/30/15 0020 5\' 8"  (1.727 m)     Head Cir --      Peak Flow --      Pain Score 09/30/15 0021 10     Pain Loc --      Pain Edu? --  Excl. in GC? --     Constitutional: Alert and oriented. Well appearing and in moderate distress. Eyes: Conjunctivae are normal. PERRL. EOMI. Head: Atraumatic. Nose: No congestion/rhinnorhea. Mouth/Throat: Mucous membranes are moist.  Oropharynx non-erythematous. Cardiovascular: Tachycardia regular rhythm. Grossly normal heart sounds.  Good peripheral circulation. Respiratory: Normal respiratory effort.  No retractions. Lungs CTAB. Gastrointestinal: Soft and nontender. No distention. Positive bowel sounds Musculoskeletal: Swelling to right lateral ankle with significant tenderness to palpation from mid calf down to foot. Neurologic:  Normal speech and language.  Skin:  Skin is warm, dry with large swelling to right lateral ankle. Area is significantly tender to palpation. Erythema  from mid foot to above the right ankle with some swelling as well. There is fluctuance of the area. Psychiatric: Mood and affect are normal.   ____________________________________________   LABS (all labs ordered are listed, but only abnormal results are displayed)  Labs Reviewed  CBC - Abnormal; Notable for the following:    WBC 18.3 (*)    Platelets 457 (*)    All other components within normal limits  BASIC METABOLIC PANEL - Abnormal; Notable for the following:    Potassium 2.9 (*)    Chloride 100 (*)    Glucose, Bld 125 (*)    BUN <5 (*)    All other components within normal limits  CULTURE, BLOOD (ROUTINE X 2)  CULTURE, BLOOD (ROUTINE X 2)   ____________________________________________  EKG  None ____________________________________________  RADIOLOGY  Ultrasound soft tissue: Complex collection with peripheral increased vascular flow demonstrated in the lateral right ankle Right ankle x-ray: Large focal soft tissue swelling over the lateral malleolus, no soft tissue gas collections, underlying bones appear intact. ____________________________________________   PROCEDURES  Procedure(s) performed: Please, see procedure note(s).   INCISION AND DRAINAGE Performed by: Lucrezia Europe P Consent: Verbal consent obtained. Risks and benefits: risks, benefits and alternatives were discussed Type: abscess  Body area: right lateral ankle  Anesthesia: local infiltration  Incision was made with a scalpel.  Local anesthetic: lidocaine 1% without epinephrine  Anesthetic total: 4 ml  Complexity: complex Blunt dissection to break up loculations  Drainage: purulent  Drainage amount: large  Packing material: 1/4 in iodoform gauze  Patient tolerance: Patient tolerated the procedure well with no immediate complications.   Critical Care performed: No  ____________________________________________   INITIAL IMPRESSION / ASSESSMENT AND PLAN / ED  COURSE  Pertinent labs & imaging results that were available during my care of the patient were reviewed by me and considered in my medical decision making (see chart for details).  This is a 34 year old female who comes in with a large right ankle abscess with cellulitis today. The patient has been on clindamycin outpatient and reports that the area has worsened. I did drain the abscess with a large amount of purulent material coming from the area. I also packed the wound. I gave the patient a dose of vancomycin as well as morphine and Zofran and Dilaudid. As the patient has failed outpatient therapy I will admit her to the hospital for further evaluation. The patient's potassium is low side did give her some potassium IV. ____________________________________________   FINAL CLINICAL IMPRESSION(S) / ED DIAGNOSES  Final diagnoses:  Abscess of bursa of ankle, right  Cellulitis of right lower extremity  Hypokalemia      Rebecka Apley, MD 09/30/15 [phone]

## 2015-09-30 NOTE — H&P (Signed)
[facility] [place] [facility]   PATIENT NAME: Penny Tucker    MR#:  [id number]  DATE OF BIRTH:  11-12-1980  DATE OF ADMISSION:  09/30/2015  PRIMARY CARE PHYSICIAN: No PCP Per Patient   REQUESTING/REFERRING PHYSICIAN:   CHIEF COMPLAINT:   Chief Complaint  Patient presents with  . Abscess    HISTORY OF PRESENT ILLNESS: Penny Tucker  is a 34 y.o. female with a no past medical history presented to the emergency room with right ankle swelling. Patient has a right ankle swelling and pain for the last 12 days. Pain is aching in nature 8 out of 10 on a scale of 1-10. Has low-grade fever and chills. Patient usually shaves her skin over the cough and ankle area and she has a cut over the right ankle area since last 1 year. It has not been healing well. In the last 12 days,it has increased in size and a lot of pain was noticed. In the emergency room the abscess was incised and drained. Patient was given IV Zosyn antibiotic and IV vancomycin and matting in the emergency room. No no nausea or vomiting. No chest pain. No shortness of breath.Patient on oral clindamycin as out patient and failed therapy.  PAST MEDICAL HISTORY:   Past Medical History  Diagnosis Date  . Drug use     PAST SURGICAL HISTORY:  Past Surgical History  Procedure Laterality Date  . Cholecystectomy    . Cesarean section      SOCIAL HISTORY:  Social History  Substance Use Topics  . Smoking status: Current Every Day Smoker -- 1.00 packs/day    Types: Cigarettes  . Smokeless tobacco: Not on file  . Alcohol Use: No    FAMILY HISTORY: History reviewed. No pertinent family history.  DRUG ALLERGIES: No Known Allergies  REVIEW OF SYSTEMS:   CONSTITUTIONAL: No fever, fatigue or weakness.  EYES: No blurred or double vision.  EARS, NOSE, AND THROAT: No tinnitus or ear pain.  RESPIRATORY: No cough, shortness of breath, wheezing or hemoptysis.  CARDIOVASCULAR: No chest pain, orthopnea,  edema.  GASTROINTESTINAL: No nausea, vomiting, diarrhea or abdominal pain.  GENITOURINARY: No dysuria, hematuria.  ENDOCRINE: No polyuria, nocturia,  HEMATOLOGY: No anemia, easy bruising or bleeding SKIN: No rash or lesion. MUSCULOSKELETAL: Right ankle swelling and pain noted   NEUROLOGIC: No tingling, numbness, weakness.  PSYCHIATRY: No anxiety or depression.   MEDICATIONS AT HOME:  Prior to Admission medications   Medication Sig Start Date End Date Taking? Authorizing Provider  clindamycin (CLEOCIN) 300 MG capsule Take 1 capsule (300 mg total) by mouth 3 (three) times daily. 09/24/15   Phineas Semen, MD      PHYSICAL EXAMINATION:   VITAL SIGNS: Blood pressure 95/54, pulse 80, temperature 98.4 F (36.9 C), temperature source Oral, resp. rate 16, height  (1.727 m), weight 68.04 kg (150 lb), last menstrual period 09/30/2015, SpO2 100 %.  GENERAL:  34 y.o.-year-old patient lying in the bed with no acute distress.  EYES: Pupils equal, round, reactive to light and accommodation. No scleral icterus. Extraocular muscles intact.  HEENT: Head atraumatic, normocephalic. Oropharynx and nasopharynx clear.  NECK:  Supple, no jugular venous distention. No thyroid enlargement, no tenderness.  LUNGS: Normal breath sounds bilaterally, no wheezing, rales,rhonchi or crepitation. No use of accessory muscles of respiration.  CARDIOVASCULAR: S1, S2 normal. No murmurs, rubs, or gallops.  ABDOMEN: Soft, nontender, nondistended. Bowel sounds present. No organomegaly or mass.  EXTREMITIES: Right ankle swelling noted  with drainage. NEUROLOGIC: Cranial nerves II through XII are intact. Muscle strength 5/5 in all extremities. Sensation intact. Gait not checked.  PSYCHIATRIC: The patient is alert and oriented x 3.  SKIN: redness noted over the right ankle with skin break down.  LABORATORY PANEL:   CBC  Recent Labs Lab 09/24/15 1451 09/30/15 0135  WBC 15.8* 18.3*  HGB 14.6 13.3  HCT 42.6 39.0   PLT 373 457*  MCV 88.4 87.7  MCH 30.3 30.0  MCHC 34.3 34.2  RDW 13.7 13.4   ------------------------------------------------------------------------------------------------------------------  Chemistries   Recent Labs Lab 09/24/15 1451 09/30/15 0135  NA 135 136  K 3.6 2.9*  CL 100* 100*  CO2 25 28  GLUCOSE 163* 125*  BUN 6 <5*  CREATININE 0.76 0.54  CALCIUM 9.1 9.0  AST 35  --   ALT 33  --   ALKPHOS 105  --   BILITOT 1.7*  --    ------------------------------------------------------------------------------------------------------------------ estimated creatinine clearance is 100.9 mL/min (by C-G formula based on Cr of 0.54). ------------------------------------------------------------------------------------------------------------------ No results for input(s): TSH, T4TOTAL, T3FREE, THYROIDAB in the last 72 hours.  Invalid input(s): FREET3   Coagulation profile No results for input(s): INR, PROTIME in the last 168 hours. ------------------------------------------------------------------------------------------------------------------- No results for input(s): DDIMER in the last 72 hours. -------------------------------------------------------------------------------------------------------------------  Cardiac Enzymes No results for input(s): CKMB, TROPONINI, MYOGLOBIN in the last 168 hours.  Invalid input(s): CK ------------------------------------------------------------------------------------------------------------------ Invalid input(s): POCBNP  ---------------------------------------------------------------------------------------------------------------  Urinalysis No results found for: COLORURINE, APPEARANCEUR, LABSPEC, PHURINE, GLUCOSEU, HGBUR, BILIRUBINUR, KETONESUR, PROTEINUR, UROBILINOGEN, NITRITE, LEUKOCYTESUR   RADIOLOGY: Dg Ankle Complete Right  09/30/2015  CLINICAL DATA:  Patient was seen in the ED 5 days ago for large abscess to the right  ankle. The areas more painful and swelling with redness noted. EXAM: RIGHT ANKLE - COMPLETE 3+ VIEW COMPARISON:  None. FINDINGS: Large focal soft tissue swelling over the lateral malleolus. Tiny calcifications within the area of swelling. No soft tissue gas collections. No bone erosion or cortical changes. No findings to suggest osteomyelitis. No evidence of fracture or dislocation of the right ankle. 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 No focal bone lesion or bone destruction. IMPRESSION: Large focal soft tissue swelling over the lateral malleolus. No soft tissue gas collections. Underlying bones appear intact. Electronically Signed   By: Burman NievesWilliam  Stevens M.D.   On: 09/30/2015 01:48   [place] Misc Soft Tissue  09/30/2015  CLINICAL DATA:  Swelling over the right lateral ankle. Patient cut the lateral ankle while shaving and the area got infected. EXAM: SOFT TISSUE ULTRASOUND - MISCELLANEOUS TECHNIQUE: Ultrasound examination focused to the soft tissues of the right lateral ankle area obtained. COMPARISON:  Right ankle radiographs 09/30/2015 FINDINGS: Corresponding to the area of clinical concern in the lateral right ankle, there is a complex heterogeneous hypo and hyperechoic circumscribed structure with peripheral flow and internal flow likely within the septation. The area measures about 4.3 x 2.1 x 2.8 cm. Appearance may represent abscess or infected hematoma. IMPRESSION: Complex collection with peripheral increased vascular flow demonstrated in the lateral right ankle. Electronically Signed   By: Burman NievesWilliam  Stevens M.D.   On: 09/30/2015 02:28    EKG: Orders placed or performed in visit on 09/06/08  . EKG 12-Lead    IMPRESSION AND PLAN: 1. Right ankle cellulitis  2. Abscess right ankle 3. Hypokalemia 4. IV drug abuse Management plan 1. Start patient on IV vancomycin antibiotic 2. Repeat incision and  drainage of right ankle  abscess if needed 3. Supplement potassium 4. Follow-up cultures  All the records are reviewed and case discussed with ED provider. Management plans discussed with the patient, family and they are in agreement.  CODE STATUS:Full    TOTAL TIME TAKING CARE OF THIS PATIENT: 48 minutes.    Ihor Austin M.D on 09/30/2015 at 4:42 AM  Between 7am to 6pm - Pager - [phone]  After 6pm go to [email or web address] - password [email or web address] [facility]  [place]  Office  [phone]  CC: Primary care physician; No PCP Per Patient

## 2015-09-30 NOTE — ED Notes (Signed)
Dr Zenda AlpersWebster performed I&D on rt ankle abscess, packed with iodoform,  Non-adhering dressing applied.

## 2015-09-30 NOTE — Progress Notes (Signed)
Patient ID: Penny Tucker, female   DOB: Mar 28, 1981, 34 y.o.   MRN: [id number] [facility] - [place] [facility]   PATIENT NAME: Penny Tucker    MR#:  [id number]  DATE OF BIRTH:  [birth year]  SUBJECTIVE:  Came in with right ankle swelling pain. Patient was found to have abscess and underwent I&D in the emergency room. Continues with pain and significant drainage of abscess  REVIEW OF SYSTEMS:   Review of Systems  Constitutional: Negative for fever, chills and weight loss.  HENT: Negative for ear discharge, ear pain and nosebleeds.   Eyes: Negative for blurred vision, pain and discharge.  Respiratory: Negative for sputum production, shortness of breath, wheezing and stridor.   Cardiovascular: Negative for chest pain, palpitations, orthopnea and PND.  Gastrointestinal: Negative for nausea, vomiting, abdominal pain and diarrhea.  Genitourinary: Negative for urgency and frequency.  Musculoskeletal: Positive for joint pain. Negative for back pain.  Skin:       Swelling over the right ankle with pain  Neurological: Positive for weakness. Negative for sensory change, speech change and focal weakness.  Psychiatric/Behavioral: Negative for depression and hallucinations. The patient is not nervous/anxious.    Tolerating Diet:yes Tolerating PT: Pending  DRUG ALLERGIES:  No Known Allergies  VITALS:  Blood pressure 96/50, pulse 67, temperature 98.6 F (37 C), temperature source Oral, resp. rate 18, height  (1.727 m), weight 68.04 kg (150 lb), last menstrual period 09/30/2015, SpO2 97 %.  PHYSICAL EXAMINATION:   Physical Exam  GENERAL:  34 y.o.-year-old patient lying in the bed with no acute distress.  EYES: Pupils equal, round, reactive to light and accommodation. No scleral icterus. Extraocular muscles intact.  HEENT: Head atraumatic, normocephalic. Oropharynx and nasopharynx clear.  NECK:  Supple, no jugular venous distention. No thyroid enlargement, no  tenderness.  LUNGS: Normal breath sounds bilaterally, no wheezing, rales, rhonchi. No use of accessory muscles of respiration.  CARDIOVASCULAR: S1, S2 normal. No murmurs, rubs, or gallops.  ABDOMEN: Soft, nontender, nondistended. Bowel sounds present. No organomegaly or mass.  EXTREMITIES: No cyanosis, clubbing or edema b/l. Large abscess 3 x 4 cm on the right lateral malleolus. oozing significant amount of pus. Surrounding   cellulitis NEUROLOGIC: Cranial nerves II through XII are intact. No focal Motor or sensory deficits b/l.   PSYCHIATRIC: The patient is alert and oriented x 3.  SKIN: No obvious rash, lesion, or ulcer.    LABORATORY PANEL:   CBC  Recent Labs Lab 09/30/15 0135  WBC 18.3*  HGB 13.3  HCT 39.0  PLT 457*    Chemistries   Recent Labs Lab 09/24/15 1451 09/30/15 0135  NA 135 136  K 3.6 2.9*  CL 100* 100*  CO2 25 28  GLUCOSE 163* 125*  BUN 6 <5*  CREATININE 0.76 0.54  CALCIUM 9.1 9.0  AST 35  --   ALT 33  --   ALKPHOS 105  --   BILITOT 1.7*  --     Cardiac Enzymes No results for input(s): TROPONINI in the last 168 hours.  RADIOLOGY:  Dg Ankle Complete Right  09/30/2015  CLINICAL DATA:  Patient was seen in the ED 5 days ago for large abscess to the right ankle. The areas more painful and swelling with redness noted. EXAM: RIGHT ANKLE - COMPLETE 3+ VIEW COMPARISON:  None. FINDINGS: Large focal soft tissue swelling over the lateral malleolus. Tiny calcifications within the area of swelling. No soft tissue gas collections. No bone erosion or cortical  changes. No findings to suggest osteomyelitis. No evidence of fracture or dislocation of the right ankle. 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 No focal bone lesion or bone destruction. IMPRESSION: Large focal soft tissue swelling over the lateral malleolus. No soft tissue gas collections. Underlying bones appear intact. Electronically  Signed   By: Burman NievesWilliam  Stevens M.D.   On: 09/30/2015 01:48   [place] Misc Soft Tissue  09/30/2015  CLINICAL DATA:  Swelling over the right lateral ankle. Patient cut the lateral ankle while shaving and the area got infected. EXAM: SOFT TISSUE ULTRASOUND - MISCELLANEOUS TECHNIQUE: Ultrasound examination focused to the soft tissues of the right lateral ankle area obtained. COMPARISON:  Right ankle radiographs 09/30/2015 FINDINGS: Corresponding to the area of clinical concern in the lateral right ankle, there is a complex heterogeneous hypo and hyperechoic circumscribed structure with peripheral flow and internal flow likely within the septation. The area measures about 4.3 x 2.1 x 2.8 cm. Appearance may represent abscess or infected hematoma. IMPRESSION: Complex collection with peripheral increased vascular flow demonstrated in the lateral right ankle. Electronically Signed   By: Burman NievesWilliam  Stevens M.D.   On: 09/30/2015 02:28     ASSESSMENT AND PLAN:   1. Right ankle cellulitis and Abscess status post I&D with packing in the emergency room.  Continue IV Vanco and Zosyn Morphine for pain Orthopedic consultation with Dr. Joice LoftsPoggi. Plan is to take patient to or for more I&D  2. Right ankle pain Anesthesia morphine  3. Hypokalemia Replete 4. IV drug abuse patient denies drug abuse. I'll check a urine drug screen.   Case discussed with Care Management/Social Worker. Management plans discussed with the patient, family and they are in agreement.  CODE STATUS: Full  DVT Prophylaxis: Lovenox  TOTAL TIME TAKING CARE OF THIS PATIENT: 40 minutes.  >50% time spent on counselling and coordination of care  POSSIBLE D/C IN one to 2 DAYS, DEPENDING ON CLINICAL CONDITION.   Delaynie Stetzer M.D on 09/30/2015 at 1:55 PM  Between 7am to 6pm - Pager - [phone]  After 6pm go to [email or web address] - password [email or web address] [name]  [facility]  Office  [phone]  CC: Primary care physician; No PCP Per  Patient

## 2015-09-30 NOTE — ED Notes (Signed)
Pt seen in Er 5 days ago for large abscess to right ankle, pt reports area is more painful and swollen, redness and a large amount of swelling noted to right ankle and foot

## 2015-09-30 NOTE — ED Notes (Signed)
Pt states she was here 09/24/15 for same, Rx Abx w/o help.

## 2015-09-30 NOTE — Progress Notes (Signed)
Paged Dr. Enedina FinnerSona Patel about patients pain 10/10.  Morphine and Norco not touching it after Dr. Dr Joice LoftsPoggi took packing out of wound and cultured it.  Dr. Allena KatzPatel telephone order to modify Morphine 3mg  every 4 hours.

## 2015-09-30 NOTE — Clinical Social Work Note (Signed)
Clinical Social Work Assessment  Patient Details  Name: Penny Tucker MRN: [id number] Date of Birth: 1981/09/22  Date of referral:  09/30/15               Reason for consult:  Substance Use/ETOH Abuse                Permission sought to share information with:    Permission granted to share information::  No  Name::        Agency::     Relationship::     Contact Information:     Housing/Transportation Living arrangements for the past 2 months:  Single Family Home Source of Information:  Patient Patient Interpreter Needed:  None Criminal Activity/Legal Involvement Pertinent to Current Situation/Hospitalization:  No - Comment as needed Significant Relationships:  Parents Lives with:  Minor Children, Parents Do you feel safe going back to the place where you live?  Yes Need for family participation in patient care:  No (Coment)  Care giving concerns: Patient lives in [place] with her 33 y.o mother and 51 y.o son.    Social Worker assessment / plan: Holiday representative (CSW) received consult for substance abuse. CSW met with patient alone at bedside. Patient was laying in the bed and was alert and oriented. CSW introduced self and explained role of CSW department. Patient was pleasant and open to substance abuse discussion. Patient reported that she lives with her mother in [place] and her 19 y.o son Edison Nasuti also lives in the home. Patient reported that she has no income and does not work or go to school. Patient reported that she relies on her mother for transportation and basic needs. Patient reported that they have enough food and clothes in the home. Patient reported that she has to renew her food stamps and knows where to do that. Patient denied alcohol use. 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 Patient reported that she knows she has to make the decision to stop and be dedicated to  recovery. Per patient she never uses herion when her son is in her supervision. Patient reported that her mother is a strong support and helps with child care. CSW provided patient with substance abuse resources including [facility]. Please reconsult if future social work needs arise. CSW signing off.    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   Patient/Family's Understanding of and Emotional Response to Diagnosis, Current Treatment, and Prognosis: Patient was pleasant throughout assessment and thanked CSW for providing resources.   Emotional Assessment Appearance:  Appears stated age Attitude/Demeanor/Rapport:    Affect (typically observed):  Accepting, Adaptable, Pleasant Orientation:  Oriented to Self, Oriented to Place, Oriented to  Time, Oriented to Situation Alcohol / Substance use:  Illicit Drugs Psych involvement (Current and /or in the community):  No (Comment)  Discharge Needs  Concerns to be addressed:  Discharge Planning Concerns Readmission within the last 30 days:  No Current discharge risk:  Substance Abuse Barriers to Discharge:  Continued Medical Work up   Loralyn Freshwater, LCSW 09/30/2015, 2:26 PM

## 2015-09-30 NOTE — ED Notes (Signed)
Pt c/o potasium burning, refuses anymore IV K+.

## 2015-09-30 NOTE — Consult Note (Signed)
ORTHOPAEDIC CONSULTATION  REQUESTING PHYSICIAN: Enedina Finner, MD  Chief Complaint:   Soft tissue abscess right lateral ankle.  History of Present Illness: Penny Tucker is a 34 y.o. female with a history of IV drug abuse describes a 12 day history of lateral sided right ankle pain, swelling, and progressive erythema. The patient notes that she cut the skin over her lateral ankle about a year ago shaving her leg. A scab was left on it which never healed. She intermittently would pick the scab. About 2 weeks ago, she picked the scab and had a core of tissue coming out. Subsequently, the soft tissue over the lateral ankle became infected. She presented to the emergency room 5 days ago and was started on clindamycin. She admits that she took double the dose twice daily in the hope of eradicating the infection without success. Because of worsening pain, she returned to the emergency room late last night and subsequently was admitted for IV antibiotics. The abscess was lanced in the emergency room and purulent material expressed. The patient recalls a history of another abscess in the volar aspect of her left wrist which was treated with formal irrigation and debridement under IV sedation. The skin was left open to close by secondary intent. She admits that that previous abscess was the result of her IV drug abuse, but insists that this one was not.  Past Medical History  Diagnosis Date  . Drug use    Past Surgical History  Procedure Laterality Date  . Cholecystectomy    . Cesarean section     Social History   Social History  . Marital Status: Single    Spouse Name: N/A  . Number of Children: N/A  . Years of Education: N/A   Social History Main Topics  . Smoking status: Current Every Day Smoker -- 1.00 packs/day    Types: Cigarettes  . Smokeless tobacco: None  . Alcohol Use: No  . Drug Use: 3.00 per week    Special: IV      Comment: Heroin-Daily  . Sexual Activity: Not Asked   Other Topics Concern  . None   Social History Narrative   History reviewed. No pertinent family history. No Known Allergies Prior to Admission medications   Medication Sig Start Date End Date Taking? Authorizing Provider  clindamycin (CLEOCIN) 300 MG capsule Take 1 capsule (300 mg total) by mouth 3 (three) times daily. 09/24/15   Phineas Semen, MD   Dg Ankle Complete Right  09/30/2015  CLINICAL DATA:  Patient was seen in the ED 5 days ago for large abscess to the right ankle. The areas more painful and swelling with redness noted. EXAM: RIGHT ANKLE - COMPLETE 3+ VIEW COMPARISON:  None. FINDINGS: Large focal soft tissue swelling over the lateral malleolus. Tiny calcifications within the area of swelling. No soft tissue gas collections. No bone erosion or cortical changes. No findings to suggest osteomyelitis. No evidence of fracture or dislocation of the right ankle. 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 No focal bone lesion or bone destruction. IMPRESSION: Large focal soft tissue swelling over the lateral malleolus. No soft tissue gas collections. Underlying bones appear intact. Electronically Signed   By: Burman Nieves M.D.   On: 09/30/2015 01:48   [place] Misc Soft Tissue  09/30/2015  CLINICAL DATA:  Swelling over the right lateral ankle. Patient cut the lateral ankle while shaving and the area got infected. EXAM: SOFT  TISSUE ULTRASOUND - MISCELLANEOUS TECHNIQUE: Ultrasound examination focused to the soft tissues of the right lateral ankle area obtained. COMPARISON:  Right ankle radiographs 09/30/2015 FINDINGS: Corresponding to the area of clinical concern in the lateral right ankle, there is a complex heterogeneous hypo and hyperechoic circumscribed structure with peripheral flow and internal flow likely within the septation. The area measures about 4.3 x 2.1 x  2.8 cm. Appearance may represent abscess or infected hematoma. IMPRESSION: Complex collection with peripheral increased vascular flow demonstrated in the lateral right ankle. Electronically Signed   By: Burman NievesWilliam  Stevens M.D.   On: 09/30/2015 02:28    Positive ROS: All other systems have been reviewed and were otherwise negative with the exception of those mentioned in the HPI and as above.  Physical Exam: General:  Alert, no acute distress Psychiatric:  Patient is competent for consent with normal mood and affect   Cardiovascular:  No pedal edema Respiratory:  No wheezing, non-labored breathing GI:  Abdomen is soft and non-tender Skin:  No lesions in the area of chief complaint Neurologic:  Sensation intact distally Lymphatic:  No axillary or cervical lymphadenopathy  Orthopedic Exam:  Orthopedic examination is limited to the right ankle and foot. There is an approximately 2.5-3 cm diameter raised abscess over the lateral malleolar region of her right ankle. There is surrounding erythema extending approximately 5-6 cm in all directions with moderate swelling of the dorsal foot. An approximately 1 cm incision is noted over the abscess. Moderate purulent drainage is emanating from this incision. She is able to actively dorsiflex and plantarflex her ankle, although with some discomfort laterally. There is no effusion of the ankle itself. She is neurovascularly intact to her foot.  X-rays:  Recent AP, lateral, and mortise views of the right ankle are available for review. These films demonstrate no evidence for fractures, lytic lesions, or significant degenerative changes.  Assessment: Soft tissue abscess lateral aspect right ankle.  Plan: The treatment options were discussed with the patient. I certainly agree with the present choice of IV antibiotics. Given the amount of purulent material in the wound, I feel it would be reasonable to proceed with a formal irrigation and debridement. I have  spoken with the anesthesiologist who wants to be sure that the tox screen is clear before considering general anesthesia, especially given that the patient admits that her last dose of IV was 2 weeks ago and her last intake of cocaine was 2 days ago. The procedure of irrigation and debridement of the wound itself was discussed with the patient, as were the potential risks (including bleeding, assistant or recurrent infection, nerve and/or blood vessel injury, persistent or recurrent pain, need for further surgery, blood clots, strokes, heart attacks and/or arrhythmias, etc.) and benefits. The patient states her understanding and wishes to proceed. A formal written consent will be obtained by the nursing staff. At this point, I will plan on trying to do this first thing tomorrow morning, assuming the tox screen is negative. Meanwhile, she should be maintained on her present IV antibiotic regimen, and to stay off her foot as much as possible. The foot to be kept elevated as much as possible as well.  Thank you for ask me to participate in the care of this most unfortunate woman. I will be happy to follow her with you.   Maryagnes AmosJ. Jeffrey Poggi, MD  Beeper #:  [phone]  09/30/2015 3:55 PM

## 2015-10-01 ENCOUNTER — Encounter: Payer: Self-pay | Admitting: Anesthesiology

## 2015-10-01 ENCOUNTER — Encounter
Admission: EM | Disposition: A | Discharge status: Home / Self Care | Payer: Self-pay | Source: Home / Self Care | Attending: Internal Medicine

## 2015-10-01 LAB — BASIC METABOLIC PANEL
ANION GAP: 5 (ref 5–15)
BUN: 6 mg/dL (ref 6–20)
CO2: 26 mmol/L (ref 22–32)
Calcium: 8.9 mg/dL (ref 8.9–10.3)
Chloride: 109 mmol/L (ref 101–111)
Creatinine, Ser: 0.61 mg/dL (ref 0.44–1.00)
GFR calc Af Amer: >60 mL/min (ref >60)
GFR calc non Af Amer: >60 mL/min (ref >60)
GLUCOSE: 97 mg/dL (ref 65–99)
POTASSIUM: 3.5 mmol/L (ref 3.5–5.1)
Sodium: 140 mmol/L (ref 135–145)

## 2015-10-01 LAB — CBC
HEMATOCRIT: 39 % (ref 35.0–47.0)
HEMOGLOBIN: 13 g/dL (ref 12.0–16.0)
MCH: 29.5 pg (ref 26.0–34.0)
MCHC: 33.2 g/dL (ref 32.0–36.0)
MCV: 88.8 fL (ref 80.0–100.0)
PLATELETS: 407 10*3/uL (ref 150–440)
RBC: 4.39 MIL/uL (ref 3.80–5.20)
RDW: 13.6 % (ref 11.5–14.5)
WBC: 10.9 10*3/uL (ref 3.6–11.0)

## 2015-10-01 LAB — PREGNANCY, URINE: Preg Test, Ur: NEGATIVE

## 2015-10-01 LAB — URINE DRUG SCREEN, QUALITATIVE (ARMC ONLY)
AMPHETAMINES, UR SCREEN: NOT DETECTED
Barbiturates, Ur Screen: NOT DETECTED
Benzodiazepine, Ur Scrn: NOT DETECTED
CANNABINOID 50 NG, UR ~~LOC~~: NOT DETECTED
Cocaine Metabolite,Ur ~~LOC~~: POSITIVE — AB
MDMA (ECSTASY) UR SCREEN: NOT DETECTED
Methadone Scn, Ur: NOT DETECTED
OPIATE, UR SCREEN: POSITIVE — AB
PHENCYCLIDINE (PCP) UR S: NOT DETECTED
Tricyclic, Ur Screen: NOT DETECTED

## 2015-10-01 LAB — VANCOMYCIN, TROUGH: VANCOMYCIN TR: 8 ug/mL — AB (ref 10–20)

## 2015-10-01 LAB — MRSA PCR SCREENING: MRSA by PCR: POSITIVE — AB

## 2015-10-01 SURGERY — INCISION AND DRAINAGE, ABSCESS
Anesthesia: General | Laterality: Right

## 2015-10-01 MED ORDER — MUPIROCIN 2 % EX OINT
1.0000 "application " | TOPICAL_OINTMENT | Freq: Two times a day (BID) | CUTANEOUS | Status: DC
  Administered 2015-10-01 – 2015-10-04 (×7): 1 via NASAL
  Filled 2015-10-01 (×2): qty 22

## 2015-10-01 MED ORDER — NICOTINE POLACRILEX 2 MG MT GUM
2.0000 mg | CHEWING_GUM | OROMUCOSAL | Status: DC | PRN
  Administered 2015-10-01: 2 mg via ORAL
  Filled 2015-10-01 (×2): qty 1

## 2015-10-01 MED ORDER — CHLORHEXIDINE GLUCONATE CLOTH 2 % EX PADS
6.0000 | MEDICATED_PAD | Freq: Every day | CUTANEOUS | Status: DC
  Administered 2015-10-01 – 2015-10-04 (×4): 6 via TOPICAL

## 2015-10-01 MED ORDER — NICOTINE 10 MG IN INHA
1.0000 | RESPIRATORY_TRACT | Status: DC | PRN
  Filled 2015-10-01: qty 36

## 2015-10-01 MED ORDER — POTASSIUM CHLORIDE CRYS ER 20 MEQ PO TBCR
40.0000 meq | EXTENDED_RELEASE_TABLET | Freq: Once | ORAL | Status: AC
  Administered 2015-10-01: 40 meq via ORAL
  Filled 2015-10-01: qty 2

## 2015-10-01 MED ORDER — MORPHINE SULFATE (PF) 4 MG/ML IV SOLN
4.0000 mg | INTRAVENOUS | Status: DC | PRN
  Administered 2015-10-02 (×2): 4 mg via INTRAVENOUS
  Filled 2015-10-01 (×2): qty 1

## 2015-10-01 MED ORDER — KCL IN DEXTROSE-NACL 20-5-0.9 MEQ/L-%-% IV SOLN: INTRAVENOUS | Status: DC

## 2015-10-01 SURGICAL SUPPLY — 33 items
BLADE SURG SZ10 CARB STEEL (BLADE) ×1 IMPLANT
BNDG COHESIVE 4X5 TAN STRL (GAUZE/BANDAGES/DRESSINGS) ×1 IMPLANT
BNDG ESMARK 4X12 TAN STRL LF (GAUZE/BANDAGES/DRESSINGS) ×1 IMPLANT
CANISTER SUCT 1200ML W/VALVE (MISCELLANEOUS) ×1 IMPLANT
CHLORAPREP W/TINT 26ML (MISCELLANEOUS) ×1 IMPLANT
GLOVE BIO SURGEON STRL SZ8 (GLOVE) ×1 IMPLANT
GLOVE INDICATOR 8.0 STRL GRN (GLOVE) ×1 IMPLANT
GLOVE SURG ORTHO 8.5 STRL (GLOVE) ×1 IMPLANT
GOWN STRL REUS W/ TWL LRG LVL3 (GOWN DISPOSABLE) ×1 IMPLANT
GOWN STRL REUS W/ TWL XL LVL3 (GOWN DISPOSABLE) ×1 IMPLANT
GOWN STRL REUS W/TWL LRG LVL3 (GOWN DISPOSABLE)
GOWN STRL REUS W/TWL XL LVL3 (GOWN DISPOSABLE)
LABEL OR SOLS (LABEL) ×1 IMPLANT
NDL SAFETY 18GX1.5 (NEEDLE) ×1 IMPLANT
NS IRRIG 1000ML POUR BTL (IV SOLUTION) ×1 IMPLANT
PACK EXTREMITY ARMC (MISCELLANEOUS) ×1 IMPLANT
PAD CAST CTTN 4X4 STRL (SOFTGOODS) ×1 IMPLANT
PAD GROUND ADULT SPLIT (MISCELLANEOUS) ×1 IMPLANT
PADDING CAST COTTON 4X4 STRL (SOFTGOODS)
SPLINT CAST 1 STEP 3X12 (MISCELLANEOUS) ×1 IMPLANT
SPONGE LAP 18X18 5 PK (GAUZE/BANDAGES/DRESSINGS) ×1 IMPLANT
STAPLER SKIN PROX 35W (STAPLE) ×1 IMPLANT
STOCKINETTE BIAS CUT 4 980044 (GAUZE/BANDAGES/DRESSINGS) ×1 IMPLANT
STOCKINETTE IMPERVIOUS 9X36 MD (GAUZE/BANDAGES/DRESSINGS) ×1 IMPLANT
STRAP SAFETY BODY (MISCELLANEOUS) ×1 IMPLANT
SUT ETHILON 4-0 (SUTURE)
SUT ETHILON 4-0 FS2 18XMFL BLK (SUTURE)
SUT VIC AB 2-0 CT1 36 (SUTURE) ×1 IMPLANT
SUT VIC AB 4-0 SH 27 (SUTURE)
SUT VIC AB 4-0 SH 27XANBCTRL (SUTURE) ×1 IMPLANT
SUT VICRYL+ 3-0 36IN CT-1 (SUTURE) ×1 IMPLANT
SUTURE ETHLN 4-0 FS2 18XMF BLK (SUTURE) ×1 IMPLANT
SYRINGE 10CC LL (SYRINGE) ×1 IMPLANT

## 2015-10-01 NOTE — Consult Note (Signed)
Electrolyte CONSULT NOTE - INITIAL  Pharmacy Consult for Potassium monitoring and replacement   Patient Measurements: Height: 5\' 8"  (172.7 cm) Weight: 150 lb (68.04 kg) IBW/kg (Calculated) : 63.9  Vital Signs: Temp: 98.6 F (37 C) (11/26 0821) Temp Source: Oral (11/26 0449) BP: 106/70 mmHg (11/26 0821) Pulse Rate: 66 (11/26 0821)   Labs: Lab Results  Component Value Date   K 3.5 10/01/2015    Estimated Creatinine Clearance: 100.9 mL/min (by C-G formula based on Cr of 0.61).   Assessment: 34 yo female with history of IV drug abuse being treated for cellulitis of the right ankle. Pharmacy consulted for monitoring and replacement of potassium.   K+ 11/25: 2.9 K+ 11/26: 3.5  Plan:  Patient has already received one dose of 40mEq K+ this morning. K+ WNL now. No further replacement is needed. K+ lab ordered for 0500 11/27. Pharmacy will continue to monitor labs and replace as needed.   Cher NakaiSheema Zorina Mallin, PharmD Pharmacy Resident 10/01/2015 8:32 AM

## 2015-10-01 NOTE — Progress Notes (Signed)
Patient ID: KAMBRE MESSNER, female   DOB: 01/31/1981, 34 y.o.   MRN: [id number] [facility] - [place] at [facility]   PATIENT NAME: Jess Sulak    MR#:  [id number]  DATE OF BIRTH:  07/05/1981  SUBJECTIVE:   right ankle swelling and pain.   REVIEW OF SYSTEMS:   Review of Systems  Constitutional: Negative for fever, chills and weight loss.  HENT: Negative for ear discharge, ear pain and nosebleeds.   Eyes: Negative for blurred vision, pain and discharge.  Respiratory: Negative for sputum production, shortness of breath, wheezing and stridor.   Cardiovascular: Negative for chest pain, palpitations, orthopnea and PND.  Gastrointestinal: Negative for nausea, vomiting, abdominal pain and diarrhea.  Genitourinary: Negative for urgency and frequency.  Musculoskeletal: Positive for joint pain. Negative for back pain.  Skin:       Swelling over the right ankle with pain  Neurological: Positive for weakness. Negative for sensory change, speech change and focal weakness.  Psychiatric/Behavioral: Negative for depression and hallucinations. The patient is not nervous/anxious.    Tolerating Diet:yes Tolerating PT: Pending  DRUG ALLERGIES:  No Known Allergies  VITALS:  Blood pressure 108/53, pulse 68, temperature 98.6 F (37 C), temperature source Oral, resp. rate 18, height  (1.727 m), weight 150 lb (68.04 kg), last menstrual period 09/30/2015, SpO2 99 %.  PHYSICAL EXAMINATION:   Physical Exam  GENERAL:  34 y.o.-year-old patient lying in the bed with no acute distress.  EYES: Pupils equal, round, reactive to light and accommodation. No scleral icterus. Extraocular muscles intact.  HEENT: Head atraumatic, normocephalic. Oropharynx and nasopharynx clear.  NECK:  Supple, no jugular venous distention. No thyroid enlargement, no tenderness.  LUNGS: Normal breath sounds bilaterally, no wheezing, rales, rhonchi. No use of accessory muscles of respiration.   CARDIOVASCULAR: S1, S2 normal. No murmurs, rubs, or gallops.  ABDOMEN: Soft, nontender, nondistended. Bowel sounds present. No organomegaly or mass.  EXTREMITIES: No cyanosis, clubbing or edema b/l. Large abscess 3 x 4 cm on the right lateral malleolus. oozing significant amount of pus. Surrounding   cellulitis NEUROLOGIC: Cranial nerves II through XII are intact. No focal Motor or sensory deficits b/l.   PSYCHIATRIC: The patient is alert and oriented x 3.  SKIN: No obvious rash, lesion, or ulcer.    LABORATORY PANEL:   CBC  Recent Labs Lab 09/30/15 0135  WBC 18.3*  HGB 13.3  HCT 39.0  PLT 457*    Chemistries   Recent Labs Lab 09/24/15 1451  10/01/15 0730  NA 135  < > 140  K 3.6  < > 3.5  CL 100*  < > 109  CO2 25  < > 26  GLUCOSE 163*  < > 97  BUN 6  < > 6  CREATININE 0.76  < > 0.61  CALCIUM 9.1  < > 8.9  AST 35  --   --   ALT 33  --   --   ALKPHOS 105  --   --   BILITOT 1.7*  --   --   < > = values in this interval not displayed.  Cardiac Enzymes No results for input(s): TROPONINI in the last 168 hours.  RADIOLOGY:  Dg Ankle Complete Right  09/30/2015  CLINICAL DATA:  Patient was seen in the ED 5 days ago for large abscess to the right ankle. The areas more painful and swelling with redness noted. EXAM: RIGHT ANKLE - COMPLETE 3+ VIEW COMPARISON:  None. FINDINGS: Large focal  soft tissue swelling over the lateral malleolus. Tiny calcifications within the area of swelling. No soft tissue gas collections. No bone erosion or cortical changes. No findings to suggest osteomyelitis. No evidence of fracture or dislocation of the right ankle. 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 No focal bone lesion or bone destruction. IMPRESSION: Large focal soft tissue swelling over the lateral malleolus. No soft tissue gas collections. Underlying bones appear intact. Electronically Signed   By: Burman NievesWilliam  Stevens  M.D.   On: 09/30/2015 01:48   [place] Misc Soft Tissue  09/30/2015  CLINICAL DATA:  Swelling over the right lateral ankle. Patient cut the lateral ankle while shaving and the area got infected. EXAM: SOFT TISSUE ULTRASOUND - MISCELLANEOUS TECHNIQUE: Ultrasound examination focused to the soft tissues of the right lateral ankle area obtained. COMPARISON:  Right ankle radiographs 09/30/2015 FINDINGS: Corresponding to the area of clinical concern in the lateral right ankle, there is a complex heterogeneous hypo and hyperechoic circumscribed structure with peripheral flow and internal flow likely within the septation. The area measures about 4.3 x 2.1 x 2.8 cm. Appearance may represent abscess or infected hematoma. IMPRESSION: Complex collection with peripheral increased vascular flow demonstrated in the lateral right ankle. Electronically Signed   By: Burman NievesWilliam  Stevens M.D.   On: 09/30/2015 02:28     ASSESSMENT AND PLAN:   1. Right ankle cellulitis and Abscess status post I&D with packing in the emergency room.  Continue IV Vanco  Orthopedic consultation with Dr. Joice LoftsPoggi.noted. Surgery cancelled due to positive UDS with cocaine.  2. Right ankle pain As needed morphine and po norco  3. Hypokalemia Repleted  4.h/o  IV drug abuse  -UDS positive for cocaine  Case discussed with Care Management/Social Worker. Management plans discussed with the patient, family and they are in agreement.  CODE STATUS: Full  DVT Prophylaxis: Lovenox  TOTAL TIME TAKING CARE OF THIS PATIENT: 40 minutes.  >50% time spent on counselling and coordination of care  POSSIBLE D/C IN one to 2 DAYS, DEPENDING ON CLINICAL CONDITION.   Keldrick Pomplun M.D on 10/01/2015 at 8:22 AM  Between 7am to 6pm - Pager - [phone]  After 6pm go to [email or web address] - password [email or web address] [facility]  [facility]  Office  [phone]  CC: Primary care physician; No PCP Per Patient

## 2015-10-01 NOTE — Progress Notes (Signed)
ANTIBIOTIC CONSULT NOTE - INITIAL  Pharmacy Consult for vancomycin Indication: RLE cellulitis with abscess  No Known Allergies  Patient Measurements: Height: 5\' 8"  (172.7 cm) Weight: 150 lb (68.04 kg) IBW/kg (Calculated) : 63.9 Adjusted Body Weight: 65.5 kg  Vital Signs: Temp: 98.6 F (37 C) (11/26 0821) Temp Source: Oral (11/26 0449) BP: 106/70 mmHg (11/26 0821) Pulse Rate: 66 (11/26 0821) Intake/Output from previous day: 11/25 0701 - 11/26 0700 In: 930 [P.O.:480; IV Piggyback:450] Out: 825 [Urine:825] Intake/Output from this shift:    Labs:  Recent Labs  09/30/15 0135 10/01/15 0730  WBC 18.3*  --   HGB 13.3  --   PLT 457*  --   CREATININE 0.54 0.61   Estimated Creatinine Clearance: 100.9 mL/min (by C-G formula based on Cr of 0.61).  Recent Labs  10/01/15 0730  VANCOTROUGH 8*     Microbiology: Recent Results (from the past 720 hour(s))  Blood culture (routine x 2)     Status: None (Preliminary result)   Collection Time: 09/30/15  1:35 AM  Result Value Ref Range Status   Specimen Description BLOOD BLOOD LEFT FOREARM  Final   Special Requests BOTTLES DRAWN AEROBIC AND ANAEROBIC 5ML  Final   Culture NO GROWTH 1 DAY  Final   Report Status PENDING  Incomplete  Blood culture (routine x 2)     Status: None (Preliminary result)   Collection Time: 09/30/15  2:19 AM  Result Value Ref Range Status   Specimen Description BLOOD LEFT ANTECUBITAL  Final   Special Requests BOTTLES DRAWN AEROBIC AND ANAEROBIC 10ML  Final   Culture NO GROWTH 1 DAY  Final   Report Status PENDING  Incomplete  MRSA PCR Screening     Status: Abnormal   Collection Time: 10/01/15  5:30 AM  Result Value Ref Range Status   MRSA by PCR POSITIVE (A) NEGATIVE Final    Comment:        The GeneXpert MRSA Assay (FDA approved for NASAL specimens only), is one component of a comprehensive MRSA colonization surveillance program. It is not intended to diagnose MRSA infection nor to guide  or monitor treatment for MRSA infections. CRITICAL RESULT CALLED TO, READ BACK BY AND VERIFIED WITH:  Webb LawsJADIE COHEN AT [id number] 10/01/15 SDR     Medical History: Past Medical History  Diagnosis Date  . Drug use     Medications:  Infusions:    Assessment: Penny Tucker cc abcess and RLE pain. Cut herself shaving approx 1 year ago and it never healed well. [facility]; also taking amoxicillin but hasn't helped. Starting IV abx and admitting for failed OP abx.   Vd 45.9 L, Ke 0.09 hr-1, T1/2 7.7 hr  Goal of Therapy:  Vancomycin trough level 15-20 mcg/ml  Plan:  Expected duration 5 days with resolution of temperature and/or normalization of WBC. Vancomycin 1 gm IV x 1 given in ED, will follow with 750 mg IV Q8H, predicted trough 16 mcg/mL. Pharmacy will continue to follow and adjust dose as needed to maintain trough 15 to 20 mcg/mL.    11/26 Trough = 8.  Will increase dose to Vanc 1g IV Q8H and recheck trough level on 11/27 at 15:30.    Stormy CardKatsoudas,Tisheena Maguire K, [facility] HospitalRPH Clinical Pharmacist  10/01/2015,8:48 AM

## 2015-10-01 NOTE — Progress Notes (Addendum)
Paged Dr. Joice LoftsPoggi. Did not get rate to run D5NS with K. Awaiting call back.

## 2015-10-01 NOTE — Progress Notes (Signed)
md paged per pt request for paim med

## 2015-10-01 NOTE — Progress Notes (Signed)
  Subjective: Day of Surgery Procedure(s) (LRB): INCISION AND DRAINAGE ABSCESS (Right), plan on procedure taking place on 10/02/15. Patient reports pain as 8 on 0-10 scale.   Patient is well, and has had no acute complaints or problems Plan is to go Home after hospital stay. Negative for chest pain and shortness of breath Fever: no Gastrointestinal:Negative for nausea and vomiting  Objective: Vital signs in last 24 hours: Temp:  [98.1 F (36.7 C)-98.6 F (37 C)] 98.6 F (37 C) (11/26 0821) Pulse Rate:  [66-85] 66 (11/26 0821) Resp:  [18] 18 (11/26 0821) BP: (106-111)/(53-70) 106/70 mmHg (11/26 0821) SpO2:  [99 %-100 %] 100 % (11/26 0821)  Intake/Output from previous day:  Intake/Output Summary (Last 24 hours) at 10/01/15 0842 Last data filed at 10/01/15 0532  Gross per 24 hour  Intake    930 ml  Output    825 ml  Net    105 ml    Intake/Output this shift:    Labs:  Recent Labs  09/30/15 0135  HGB 13.3    Recent Labs  09/30/15 0135  WBC 18.3*  RBC 4.45  HCT 39.0  PLT 457*    Recent Labs  09/30/15 0135 10/01/15 0730  NA 136 140  K 2.9* 3.5  CL 100* 109  CO2 28 26  BUN <5* 6  CREATININE 0.54 0.61  GLUCOSE 125* 97  CALCIUM 9.0 8.9   No results for input(s): LABPT, INR in the last 72 hours.   EXAM General - Patient is Alert, Appropriate and Oriented Extremity - Neurologically intact ABD soft Sensation intact distally Dressing/Incision - clean, dry, no drainage, dressing change performed this morning. Motor Function - intact, moving foot and toes well on exam.   Abdomen soft on exam. Upon changing the dressing there is a large abscess present at the site of the right lateral malleolus, the purulent drainage appears less than previously documented in notes.  The abscess appears to be 3 cm in diameter with surrounding erythema, extending 5cm in all directions. She is able to actively dorsiflex and plantarflex her ankle with moderate pain.  Past  Medical History  Diagnosis Date  . Drug use     Assessment/Plan: Day of Surgery Procedure(s) (LRB): INCISION AND DRAINAGE ABSCESS (Right) Principal Problem:   Ankle abscess Active Problems:   Cellulitis of right ankle  Estimated body mass index is 22.81 kg/(m^2) as calculated from the following:   Height as of this encounter: 5\' 8"  (1.727 m).   Weight as of this encounter: 68.04 kg (150 lb). Advance diet   K+ 3.5 this AM.  I have spoken with internal medicine about the potassium. Plan was to take the patient to the OR today.  Tox screen returned positive for cocaine as well as opiates.  Spoke with Dr. Joice LoftsPoggi who plans on taking her to the OR tomorrow morning, 10/01/15.  Will re-order her NPO order for midnight tonight, tox screen re-ordered.   DVT Prophylaxis - Lovenox, Foot Pumps and TED hose, however the patient has refused lovenox injections this AM. Non-weightbearing to the right lower extremity.  Pt should try to elevate the leg as much as possible.  Valeria BatmanJ. Lance McGhee, PA-C [facility] 10/01/2015, 8:42 AM

## 2015-10-01 NOTE — Progress Notes (Signed)
Pt refused lovenox, pt educated concerning DVT Prevention , pt still refused lovenox

## 2015-10-01 NOTE — Progress Notes (Signed)
Anne HahnWillis, MD returned page, he is to place new order

## 2015-10-01 NOTE — Anesthesia Preprocedure Evaluation (Addendum)
Anesthesia Evaluation  Patient identified by MRN, date of birth, ID band Patient awake    Reviewed: Allergy & Precautions, H&P , NPO status , Patient's Chart, lab work & pertinent test results, reviewed documented beta blocker date and time   Airway Mallampati: II  TM Distance: >3 FB Neck ROM: full    Dental no notable dental hx. (+) Chipped   Pulmonary neg shortness of breath, asthma , Current Smoker,    Pulmonary exam normal breath sounds clear to auscultation       Cardiovascular Exercise Tolerance: Good (-) angina(-) Past MI and (-) DOE negative cardio ROS Normal cardiovascular exam Rhythm:regular Rate:Normal     Neuro/Psych negative neurological ROS  negative psych ROS   GI/Hepatic negative GI ROS, Neg liver ROS,   Endo/Other  negative endocrine ROS  Renal/GU negative Renal ROS  negative genitourinary   Musculoskeletal   Abdominal   Peds  Hematology negative hematology ROS (+)   Anesthesia Other Findings Cocaine user.  Reproductive/Obstetrics negative OB ROS                           Anesthesia Physical Anesthesia Plan  ASA: III  Anesthesia Plan: General LMA   Post-op Pain Management:    Induction: Intravenous  Airway Management Planned: Oral ETT and LMA  Additional Equipment:   Intra-op Plan:   Post-operative Plan:   Informed Consent: I have reviewed the patients History and Physical, chart, labs and discussed the procedure including the risks, benefits and alternatives for the proposed anesthesia with the patient or authorized representative who has indicated his/her understanding and acceptance.   Dental Advisory Given  Plan Discussed with: CRNA  Anesthesia Plan Comments:        Anesthesia Quick Evaluation

## 2015-10-02 ENCOUNTER — Inpatient Hospital Stay: Payer: Medicaid Other | Admitting: Anesthesiology

## 2015-10-02 ENCOUNTER — Encounter: Payer: Self-pay | Admitting: Anesthesiology

## 2015-10-02 ENCOUNTER — Encounter
Admission: EM | Disposition: A | Discharge status: Home / Self Care | Payer: Self-pay | Source: Home / Self Care | Attending: Internal Medicine

## 2015-10-02 HISTORY — PX: INCISION AND DRAINAGE ABSCESS: SHX5864

## 2015-10-02 LAB — URINE DRUG SCREEN, QUALITATIVE (ARMC ONLY)
Amphetamines, Ur Screen: NOT DETECTED
BARBITURATES, UR SCREEN: NOT DETECTED
BENZODIAZEPINE, UR SCRN: NOT DETECTED
CANNABINOID 50 NG, UR ~~LOC~~: NOT DETECTED
Cocaine Metabolite,Ur ~~LOC~~: NOT DETECTED
MDMA (Ecstasy)Ur Screen: NOT DETECTED
METHADONE SCREEN, URINE: NOT DETECTED
Opiate, Ur Screen: POSITIVE — AB
Phencyclidine (PCP) Ur S: NOT DETECTED
TRICYCLIC, UR SCREEN: NOT DETECTED

## 2015-10-02 LAB — VANCOMYCIN, TROUGH: Vancomycin Tr: 9 ug/mL — ABNORMAL LOW (ref 10–20)

## 2015-10-02 LAB — POTASSIUM: Potassium: 3.6 mmol/L (ref 3.5–5.1)

## 2015-10-02 SURGERY — INCISION AND DRAINAGE, ABSCESS
Anesthesia: General | Laterality: Right

## 2015-10-02 MED ORDER — FLEET ENEMA 7-19 GM/118ML RE ENEM: 1.0000 | ENEMA | Freq: Once | RECTAL | Status: DC | PRN

## 2015-10-02 MED ORDER — ACETAMINOPHEN 650 MG RE SUPP: 650.0000 mg | Freq: Four times a day (QID) | RECTAL | Status: DC | PRN

## 2015-10-02 MED ORDER — FENTANYL CITRATE (PF) 100 MCG/2ML IJ SOLN
INTRAMUSCULAR | Status: DC | PRN
  Administered 2015-10-02 (×4): 50 ug via INTRAVENOUS

## 2015-10-02 MED ORDER — LACTATED RINGERS IV SOLN
INTRAVENOUS | Status: DC | PRN
  Administered 2015-10-02: 08:00:00 via INTRAVENOUS

## 2015-10-02 MED ORDER — OXYCODONE HCL 5 MG PO TABS
5.0000 mg | ORAL_TABLET | ORAL | Status: DC | PRN
  Administered 2015-10-02: 5 mg via ORAL
  Administered 2015-10-02 – 2015-10-03 (×3): 10 mg via ORAL
  Administered 2015-10-03: 5 mg via ORAL
  Administered 2015-10-03 – 2015-10-04 (×4): 10 mg via ORAL
  Filled 2015-10-02 (×9): qty 2

## 2015-10-02 MED ORDER — KCL IN DEXTROSE-NACL 20-5-0.9 MEQ/L-%-% IV SOLN
INTRAVENOUS | Status: DC
  Administered 2015-10-02: 14:00:00 via INTRAVENOUS
  Filled 2015-10-02 (×7): qty 1000

## 2015-10-02 MED ORDER — BISACODYL 10 MG RE SUPP: 10.0000 mg | Freq: Every day | RECTAL | Status: DC | PRN

## 2015-10-02 MED ORDER — PROPOFOL 10 MG/ML IV BOLUS
INTRAVENOUS | Status: DC | PRN
  Administered 2015-10-02: 150 mg via INTRAVENOUS
  Administered 2015-10-02: 50 mg via INTRAVENOUS

## 2015-10-02 MED ORDER — ONDANSETRON HCL 4 MG PO TABS: 4.0000 mg | ORAL_TABLET | Freq: Four times a day (QID) | ORAL | Status: DC | PRN

## 2015-10-02 MED ORDER — HYDROMORPHONE HCL 1 MG/ML IJ SOLN
1.0000 mg | INTRAMUSCULAR | Status: DC | PRN
  Administered 2015-10-02 – 2015-10-03 (×7): 1 mg via INTRAVENOUS
  Administered 2015-10-03: 2 mg via INTRAVENOUS
  Administered 2015-10-04 (×2): 1 mg via INTRAVENOUS
  Filled 2015-10-02 (×5): qty 1
  Filled 2015-10-02: qty 2
  Filled 2015-10-02 (×5): qty 1

## 2015-10-02 MED ORDER — DOCUSATE SODIUM 100 MG PO CAPS
100.0000 mg | ORAL_CAPSULE | Freq: Two times a day (BID) | ORAL | Status: DC
  Administered 2015-10-02 – 2015-10-04 (×5): 100 mg via ORAL
  Filled 2015-10-02 (×5): qty 1

## 2015-10-02 MED ORDER — ONDANSETRON HCL 4 MG/2ML IJ SOLN: 4.0000 mg | Freq: Four times a day (QID) | INTRAMUSCULAR | Status: DC | PRN

## 2015-10-02 MED ORDER — ACETAMINOPHEN 325 MG PO TABS: 650.0000 mg | ORAL_TABLET | Freq: Four times a day (QID) | ORAL | Status: DC | PRN

## 2015-10-02 MED ORDER — DEXAMETHASONE SODIUM PHOSPHATE 4 MG/ML IJ SOLN
INTRAMUSCULAR | Status: DC | PRN
  Administered 2015-10-02: 10 mg via INTRAVENOUS

## 2015-10-02 MED ORDER — METOCLOPRAMIDE HCL 5 MG/ML IJ SOLN: 5.0000 mg | Freq: Three times a day (TID) | INTRAMUSCULAR | Status: DC | PRN

## 2015-10-02 MED ORDER — DIPHENHYDRAMINE HCL 12.5 MG/5ML PO ELIX: 12.5000 mg | ORAL_SOLUTION | ORAL | Status: DC | PRN

## 2015-10-02 MED ORDER — VANCOMYCIN HCL IN DEXTROSE 1-5 GM/200ML-% IV SOLN
1000.0000 mg | Freq: Three times a day (TID) | INTRAVENOUS | Status: DC
  Administered 2015-10-03 – 2015-10-04 (×4): 1000 mg via INTRAVENOUS
  Filled 2015-10-02 (×6): qty 200

## 2015-10-02 MED ORDER — METOCLOPRAMIDE HCL 5 MG PO TABS: 5.0000 mg | ORAL_TABLET | Freq: Three times a day (TID) | ORAL | Status: DC | PRN

## 2015-10-02 MED ORDER — LIDOCAINE HCL (CARDIAC) 20 MG/ML IV SOLN
INTRAVENOUS | Status: DC | PRN
  Administered 2015-10-02: 100 mg via INTRAVENOUS

## 2015-10-02 MED ORDER — KETOROLAC TROMETHAMINE 15 MG/ML IJ SOLN
15.0000 mg | Freq: Four times a day (QID) | INTRAMUSCULAR | Status: AC
  Administered 2015-10-02 – 2015-10-03 (×6): 15 mg via INTRAVENOUS
  Filled 2015-10-02 (×6): qty 1

## 2015-10-02 MED ORDER — KETOROLAC TROMETHAMINE 30 MG/ML IJ SOLN
30.0000 mg | Freq: Once | INTRAMUSCULAR | Status: AC
  Administered 2015-10-02: 30 mg via INTRAVENOUS
  Filled 2015-10-02: qty 1

## 2015-10-02 MED ORDER — MAGNESIUM HYDROXIDE 400 MG/5ML PO SUSP
30.0000 mL | Freq: Every day | ORAL | Status: DC | PRN
  Administered 2015-10-03: 30 mL via ORAL
  Filled 2015-10-02: qty 30

## 2015-10-02 MED ORDER — FENTANYL CITRATE (PF) 100 MCG/2ML IJ SOLN
25.0000 ug | INTRAMUSCULAR | Status: DC | PRN
  Administered 2015-10-02 (×4): 25 ug via INTRAVENOUS

## 2015-10-02 MED ORDER — OXYCODONE HCL 5 MG PO TABS: 5.0000 mg | ORAL_TABLET | Freq: Once | ORAL | Status: DC | PRN

## 2015-10-02 MED ORDER — ENOXAPARIN SODIUM 40 MG/0.4ML ~~LOC~~ SOLN
40.0000 mg | SUBCUTANEOUS | Status: DC
  Filled 2015-10-02: qty 0.4

## 2015-10-02 MED ORDER — OXYCODONE HCL 5 MG/5ML PO SOLN: 5.0000 mg | Freq: Once | ORAL | Status: DC | PRN

## 2015-10-02 MED ORDER — HYDROMORPHONE HCL 1 MG/ML IJ SOLN
0.2500 mg | INTRAMUSCULAR | Status: DC | PRN
  Administered 2015-10-02: 0.5 mg via INTRAVENOUS

## 2015-10-02 SURGICAL SUPPLY — 37 items
BLADE SURG SZ10 CARB STEEL (BLADE) ×3 IMPLANT
BNDG COHESIVE 4X5 TAN STRL (GAUZE/BANDAGES/DRESSINGS) ×3 IMPLANT
BNDG ESMARK 4X12 TAN STRL LF (GAUZE/BANDAGES/DRESSINGS) ×3 IMPLANT
CANISTER SUCT 1200ML W/VALVE (MISCELLANEOUS) ×3 IMPLANT
CHLORAPREP W/TINT 26ML (MISCELLANEOUS) ×3 IMPLANT
GAUZE FLUFF 18X24 1PLY STRL (GAUZE/BANDAGES/DRESSINGS) ×2 IMPLANT
GAUZE SPONGE 4X4 12PLY STRL (GAUZE/BANDAGES/DRESSINGS) ×2 IMPLANT
GAUZE XEROFORM 4X4 STRL (GAUZE/BANDAGES/DRESSINGS) ×2 IMPLANT
GLOVE BIO SURGEON STRL SZ8 (GLOVE) ×3 IMPLANT
GLOVE INDICATOR 8.0 STRL GRN (GLOVE) ×3 IMPLANT
GLOVE SURG ORTHO 8.5 STRL (GLOVE) ×3 IMPLANT
GOWN STRL REUS W/ TWL LRG LVL3 (GOWN DISPOSABLE) ×1 IMPLANT
GOWN STRL REUS W/ TWL XL LVL3 (GOWN DISPOSABLE) ×1 IMPLANT
GOWN STRL REUS W/TWL LRG LVL3 (GOWN DISPOSABLE) ×3
GOWN STRL REUS W/TWL XL LVL3 (GOWN DISPOSABLE) ×3
LABEL OR SOLS (LABEL) ×3 IMPLANT
NDL SAFETY 18GX1.5 (NEEDLE) ×3 IMPLANT
NS IRRIG 1000ML POUR BTL (IV SOLUTION) ×3 IMPLANT
PACK EXTREMITY ARMC (MISCELLANEOUS) ×3 IMPLANT
PAD ABD DERMACEA PRESS 5X9 (GAUZE/BANDAGES/DRESSINGS) ×2 IMPLANT
PAD CAST CTTN 4X4 STRL (SOFTGOODS) ×1 IMPLANT
PAD GROUND ADULT SPLIT (MISCELLANEOUS) ×3 IMPLANT
PADDING CAST COTTON 4X4 STRL (SOFTGOODS) ×3
SPLINT CAST 1 STEP 3X12 (MISCELLANEOUS) ×3 IMPLANT
SPONGE LAP 18X18 5 PK (GAUZE/BANDAGES/DRESSINGS) ×3 IMPLANT
STAPLER SKIN PROX 35W (STAPLE) ×3 IMPLANT
STOCKINETTE BIAS CUT 4 980044 (GAUZE/BANDAGES/DRESSINGS) ×3 IMPLANT
STOCKINETTE IMPERVIOUS 9X36 MD (GAUZE/BANDAGES/DRESSINGS) ×3 IMPLANT
STRAP SAFETY BODY (MISCELLANEOUS) ×3 IMPLANT
SUT ETHILON 4-0 (SUTURE) ×3
SUT ETHILON 4-0 FS2 18XMFL BLK (SUTURE) ×1
SUT VIC AB 2-0 CT1 36 (SUTURE) ×3 IMPLANT
SUT VIC AB 4-0 SH 27 (SUTURE) ×3
SUT VIC AB 4-0 SH 27XANBCTRL (SUTURE) ×1 IMPLANT
SUT VICRYL+ 3-0 36IN CT-1 (SUTURE) ×3 IMPLANT
SUTURE ETHLN 4-0 FS2 18XMF BLK (SUTURE) ×1 IMPLANT
SYRINGE 10CC LL (SYRINGE) ×3 IMPLANT

## 2015-10-02 NOTE — Progress Notes (Addendum)
ANTIBIOTIC CONSULT NOTE - INITIAL  Pharmacy Consult for vancomycin Indication: RLE cellulitis with abscess  No Known Allergies  Patient Measurements: Height:  (172.7 cm) Weight: 150 lb (68.04 kg) IBW/kg (Calculated) : 63.9 Adjusted Body Weight: 65.5 kg  Vital Signs: Temp: 97 F (36.1 C) (11/27 1646) Temp Source: Oral (11/27 1343) BP: 114/69 mmHg (11/27 1737) Pulse Rate: 81 (11/27 1737) Intake/Output from previous day: 11/26 0701 - 11/27 0700 In: 350 [IV Piggyback:350] Out: -  Intake/Output from this shift:    Labs:  Recent Labs  09/30/15 0135 10/01/15 0730 10/01/15 0855  WBC 18.3*  --  10.9  HGB 13.3  --  13.0  PLT 457*  --  407  CREATININE 0.54 0.61  --    Estimated Creatinine Clearance: 100.9 mL/min (by C-G formula based on Cr of 0.61).  Recent Labs  10/01/15 0730  VANCOTROUGH 8*     Microbiology: Recent Results (from the past 720 hour(s))  Blood culture (routine x 2)     Status: None (Preliminary result)   Collection Time: 09/30/15  1:35 AM  Result Value Ref Range Status   Specimen Description BLOOD BLOOD LEFT FOREARM  Final   Special Requests BOTTLES DRAWN AEROBIC AND ANAEROBIC  Final   Culture NO GROWTH 2 DAYS  Final   Report Status PENDING  Incomplete  Blood culture (routine x 2)     Status: None (Preliminary result)   Collection Time: 09/30/15  2:19 AM  Result Value Ref Range Status   Specimen Description BLOOD LEFT ANTECUBITAL  Final   Special Requests BOTTLES DRAWN AEROBIC AND ANAEROBIC  Final   Culture NO GROWTH 2 DAYS  Final   Report Status PENDING  Incomplete  Wound culture     Status: None (Preliminary result)   Collection Time: 09/30/15  3:53 PM  Result Value Ref Range Status   Specimen Description ABSCESS  Final   Special Requests NONE  Final   Gram Stain PENDING  Incomplete   Culture   Final    MODERATE GROWTH METHICILLIN RESISTANT STAPHYLOCOCCUS AUREUS CRITICAL RESULT CALLED TO, READ BACK BY AND VERIFIED WITH: ALANA  HODGES AT 1118 10/02/15 DV    Report Status PENDING  Incomplete   Organism ID, Bacteria METHICILLIN RESISTANT STAPHYLOCOCCUS AUREUS  Final      Susceptibility   Methicillin resistant staphylococcus aureus - MIC*    CIPROFLOXACIN >=8 RESISTANT Resistant     GENTAMICIN <=0.5 SENSITIVE Sensitive     OXACILLIN >=4 RESISTANT Resistant     VANCOMYCIN 1 SENSITIVE Sensitive     TRIMETH/SULFA <=10 SENSITIVE Sensitive     CEFOXITIN SCREEN Value in next row Resistant      POSITIVECEFOXITIN SCREEN - This test may be used to predict mecA-mediated oxacillin resistance, and it is based on the cefoxitin disk screen test.  The cefoxitin screen and oxacillin work in combination to determine the final interpretation reported for oxacillin.     Inducible Clindamycin Value in next row Sensitive      POSITIVECEFOXITIN SCREEN - This test may be used to predict mecA-mediated oxacillin resistance, and it is based on the cefoxitin disk screen test.  The cefoxitin screen and oxacillin work in combination to determine the final interpretation reported for oxacillin.     TETRACYCLINE Value in next row Sensitive      SENSITIVE<=1    * MODERATE GROWTH METHICILLIN RESISTANT STAPHYLOCOCCUS AUREUS  MRSA PCR Screening     Status: Abnormal   Collection Time: 10/01/15  5:30  AM  Result Value Ref Range Status   MRSA by PCR POSITIVE (A) NEGATIVE Final    Comment:        The GeneXpert MRSA Assay (FDA approved for NASAL specimens only), is one component of a comprehensive MRSA colonization surveillance program. It is not intended to diagnose MRSA infection nor to guide or monitor treatment for MRSA infections. CRITICAL RESULT CALLED TO, READ BACK BY AND VERIFIED WITH:  Webb LawsJADIE COHEN AT [id number] 10/01/15 SDR     Medical History: Past Medical History  Diagnosis Date  . Drug use     Medications:  Infusions:  . dextrose 5 % and 0.9 % NaCl with KCl 20 mEq/L 100 mL/hr at 10/02/15 1356   Assessment: 34 yof cc abcess and  RLE pain. Cut herself shaving approx 1 year ago and it never healed well. [facility]; also taking amoxicillin but hasn't helped. Starting IV abx and admitting for failed OP abx.   Vd 45.9 L, Ke 0.09 hr-1, T1/2 7.7 hr  Goal of Therapy:  Vancomycin trough level 15-20 mcg/ml  Plan:  Expected duration 5 days with resolution of temperature and/or normalization of WBC. Vancomycin 1 gm IV x 1 given in ED, will follow with 750 mg IV Q8H, predicted trough 16 mcg/mL. Pharmacy will continue to follow and adjust dose as needed to maintain trough 15 to 20 mcg/mL.    11/26 Trough = 8.  Will increase dose to Vanc 1g IV Q8H and recheck trough level on 11/27 at 15:30.    11/27:  Vanc trough scheduled for 15:30 was not drawn.  As of 19:30, pt has no IV access.  RN anticipates pt will have IV access by 11/28 AM @ 5:00.  Vanc trough 11/27 @ 19:30 = 9 mcg/mL Will increase dose to Vancomycin 1 gm IV Q8H and recheck vanc trough on 11/29 @ 4:30.   Marva Hendryx D, [facility] LLCRPH Clinical Pharmacist  10/02/2015,7:25 PM

## 2015-10-02 NOTE — Addendum Note (Signed)
Addendum  created 10/02/15 1033 by Rosaria FerriesJoseph K Piscitello, MD   Modules edited: Anesthesia Blocks and Procedures, Clinical Notes   Clinical Notes:  File: [id number]

## 2015-10-02 NOTE — Evaluation (Signed)
Physical Therapy Evaluation Patient Details Name: Penny Tucker MRN: [id number] DOB: 02/04/1981 Today's Date: 10/02/2015   History of Present Illness  Pt with R ankle infection with I&D 11/27  Clinical Impression  Pt is able to ambulate well with good safety, speed and confidence with FWW.  Pt will likely benefit from crutches, plan to try crutches next session as well as insuring safety on steps.  Pt reports 8/10 pain in R ankle, otherwise she feels good.     Follow Up Recommendations No PT follow up    Equipment Recommendations  Crutches    Recommendations for Other Services       Precautions / Restrictions Precautions Precautions: None Restrictions Weight Bearing Restrictions: Yes RLE Weight Bearing: Non weight bearing      Mobility  Bed Mobility Overal bed mobility: Independent                Transfers Overall transfer level: Independent Equipment used:  (pt able to rise w/o the use of walker to balance)             General transfer comment: no safety issues, good confidence  Ambulation/Gait Ambulation/Gait assistance: Modified independent (Device/Increase time) Ambulation Distance (Feet): 50 Feet Assistive device: Rolling walker (2 wheeled)       General Gait Details: hop-to NWBing gait with no issues, pt able to advance efficiently and with good speed though she does actually need some cuing to slow down  Stairs            Wheelchair Mobility    Modified Rankin (Stroke Patients Only)       Balance                                             Pertinent Vitals/Pain Pain Assessment: No/denies pain    Home Living Family/patient expects to be discharged to:: Private residence Living Arrangements: Parent;Children Available Help at Discharge: Family   Home Access: Stairs to enter Entrance Stairs-Rails: Left Entrance Stairs-Number of Steps: 7   Home Equipment: Walker - 2 wheels      Prior Function Level of  Independence: Independent         Comments: PT not working, able to active and out of the home     Hand Dominance        Extremity/Trunk Assessment   Upper Extremity Assessment: Overall WFL for tasks assessed           Lower Extremity Assessment: Overall WFL for tasks assessed         Communication   Communication: No difficulties  Cognition Arousal/Alertness: Awake/alert Behavior During Therapy: WFL for tasks assessed/performed Overall Cognitive Status: Within Functional Limits for tasks assessed                      General Comments      Exercises        Assessment/Plan    PT Assessment    PT Diagnosis Abnormality of gait;Acute pain   PT Problem List    PT Treatment Interventions     PT Goals (Current goals can be found in the Care Plan section) Acute Rehab PT Goals Patient Stated Goal: Pt eager to be able to put weight through her R LE PT Goal Formulation: With patient Time For Goal Achievement: 10/16/15 Potential to Achieve Goals: Good    Frequency  Barriers to discharge        Co-evaluation               End of Session Equipment Utilized During Treatment: Gait belt Activity Tolerance: Patient tolerated treatment well Patient left: with bed alarm set Nurse Communication: Mobility status         Time: [id number] PT Time Calculation (min) (ACUTE ONLY): 13 min   Charges:   PT Evaluation $Initial PT Evaluation Tier I: 1 Procedure     PT G Codes:       Penny Tucker, PT, DPT [phone]  Penny Tucker 10/02/2015, 5:46 PM

## 2015-10-02 NOTE — Transfer of Care (Signed)
Immediate Anesthesia Transfer of Care Note  Patient: Penny Tucker  Procedure(s) Performed: Procedure(s): INCISION AND DRAINAGE ABSCESS (Right)  Patient Location: PACU  Anesthesia Type:General  Level of Consciousness: awake, alert  and oriented  Airway & Oxygen Therapy: Patient Spontanous Breathing  Post-op Assessment: Report given to RN  Post vital signs: Reviewed and stable  Last Vitals:  Filed Vitals:   10/02/15 0355 10/02/15 0737  BP: 118/72 112/70  Pulse: 67 68  Temp: 36.7 C 36.6 C  Resp: 18 16    Complications: No apparent anesthesia complications

## 2015-10-02 NOTE — Op Note (Signed)
09/30/2015 - 10/02/2015  9:03 AM  Patient:   Penny Tucker  Pre-Op Diagnosis:   Abscess right lateral ankle.  Post-Op Diagnosis:   Same.  Procedure:   Irrigation and debridement of abscess lateral right ankle.  Surgeon:   Maryagnes AmosJ. Jeffrey Rhylen Shaheen, MD  Assistant:   None  Anesthesia:   General LMA  Findings:   As above.  Complications:   None  EBL:   10 cc  Fluids:   400 cc crystalloid  TT:   29 minutes at 300 mmHg  Drains:   Penrose drain 1  Closure:   4-0 nylon interrupted sutures  Implants:   None  Brief Clinical Note:   The patient is a 34 year old female IV drug abuser who developed an abscess over the lateral aspect of her right ankle 2 weeks ago. She was seen in the emergency room last week and started on clindamycin. Her symptoms did not improve so she returned to the emergency room 3 days ago and was subsequently admitted for IV antibiotics and definitive management of her abscess. Because of recent use of cocaine, her urine toxicology screen has finally cleared and she presents at this time for formal I&D of her right lateral ankle abscess.  Procedure:   The patient was brought into the operating room and lain in the supine position. After adequate general laryngal mask anesthesia was obtained, the patient's right foot and lower leg were prepped with a Betadine scrub followed by a ChloraPrep solution before the foot and lower leg were draped sterilely. The patient was already on antibiotics in the hospital, so no additional antibiotics were administered. The leg was elevated for approximately a minute before the tourniquet was inflated to 300 mmHg. The margins of the stab incision made in the emergency room were debrided sharply with a 15 blade before the incision was extended approximate 1 cm proximally and distally. The wound was debrided extensively using a curet, rongeur, and Metzenbaum scissors before it was copiously irrigated with sterile saline solution using bulb  irrigation. A Penrose drain was inserted before the wound was loosely reapproximated using 4-0 nylon interrupted sutures. A sterile bulky dressing was applied to the ankle before the patient was awakened, extubated, and returned to the recovery room in satisfactory condition after tolerating the procedure well.

## 2015-10-02 NOTE — Anesthesia Procedure Notes (Addendum)
Procedure Name: LMA Insertion Date/Time: 10/02/2015 8:07 AM Performed by: Rosaria FerriesPISCITELLO, Kayn Haymore K Pre-anesthesia Checklist: Patient identified, Patient being monitored, Timeout performed, Emergency Drugs available and Suction available Patient Re-evaluated:Patient Re-evaluated prior to inductionOxygen Delivery Method: Circle system utilized Preoxygenation: Pre-oxygenation with 100% oxygen Intubation Type: IV induction Ventilation: Mask ventilation without difficulty LMA: LMA inserted LMA Size: 4.0 Tube type: Oral Number of attempts: 1 Placement Confirmation: positive ETCO2 and breath sounds checked- equal and bilateral Tube secured with: Tape Dental Injury: Teeth and Oropharynx as per pre-operative assessment

## 2015-10-02 NOTE — Progress Notes (Signed)
Patient ID: NOELY KUHNLE, female   DOB: February 24, 1981, 34 y.o.   MRN: [id number] [facility] - [place] at [facility]   PATIENT NAME: Sheily Lineman    MR#:  [id number]  DATE OF BIRTH:  Jul 01, 1981  SUBJECTIVE:   right ankle swelling and pain.   REVIEW OF SYSTEMS:   Review of Systems  Constitutional: Negative for fever, chills and weight loss.  HENT: Negative for ear discharge, ear pain and nosebleeds.   Eyes: Negative for blurred vision, pain and discharge.  Respiratory: Negative for sputum production, shortness of breath, wheezing and stridor.   Cardiovascular: Negative for chest pain, palpitations, orthopnea and PND.  Gastrointestinal: Negative for nausea, vomiting, abdominal pain and diarrhea.  Genitourinary: Negative for urgency and frequency.  Musculoskeletal: Positive for joint pain. Negative for back pain.  Skin:       Swelling over the right ankle with pain  Neurological: Positive for weakness. Negative for sensory change, speech change and focal weakness.  Psychiatric/Behavioral: Negative for depression and hallucinations. The patient is not nervous/anxious.    Tolerating Diet:yes Tolerating PT: Pending  DRUG ALLERGIES:  No Known Allergies  VITALS:  Blood pressure 118/72, pulse 67, temperature 98.1 F (36.7 C), temperature source Oral, resp. rate 18, height  (1.727 m), weight 150 lb (68.04 kg), last menstrual period 09/30/2015, SpO2 100 %.  PHYSICAL EXAMINATION:   Physical Exam  GENERAL:  34 y.o.-year-old patient lying in the bed with no acute distress.  EYES: Pupils equal, round, reactive to light and accommodation. No scleral icterus. Extraocular muscles intact.  HEENT: Head atraumatic, normocephalic. Oropharynx and nasopharynx clear.  NECK:  Supple, no jugular venous distention. No thyroid enlargement, no tenderness.  LUNGS: Normal breath sounds bilaterally, no wheezing, rales, rhonchi. No use of accessory muscles of respiration.   CARDIOVASCULAR: S1, S2 normal. No murmurs, rubs, or gallops.  ABDOMEN: Soft, nontender, nondistended. Bowel sounds present. No organomegaly or mass.  EXTREMITIES: No cyanosis, clubbing or edema b/l. Large abscess 3 x 4 cm on the right lateral malleolus. oozing significant amount of pus. Surrounding   cellulitis NEUROLOGIC: Cranial nerves II through XII are intact. No focal Motor or sensory deficits b/l.   PSYCHIATRIC: The patient is alert and oriented x 3.  SKIN: No obvious rash, lesion, or ulcer.    LABORATORY PANEL:   CBC  Recent Labs Lab 10/01/15 0855  WBC 10.9  HGB 13.0  HCT 39.0  PLT 407    Chemistries   Recent Labs Lab 10/01/15 0730 10/02/15 0446  NA 140  --   K 3.5 3.6  CL 109  --   CO2 26  --   GLUCOSE 97  --   BUN 6  --   CREATININE 0.61  --   CALCIUM 8.9  --     Cardiac Enzymes No results for input(s): TROPONINI in the last 168 hours.  RADIOLOGY:  No results found.   ASSESSMENT AND PLAN:   1. Right ankle cellulitis and Abscess status post I&D with packing in the emergency room.  Continue IV Vanco  Orthopedic consultation with Dr. Joice Lofts.noted. Surgery cancelled due to positive UDS with cocaine. UDS today 11/27 neg for cocaine. To OR today  2. Right ankle pain As needed morphine and po norco  3. Hypokalemia Repleted  4.h/o  IV drug abuse  -UDS positive for cocaine on admission  Case discussed with Care Management/Social Worker. Management plans discussed with the patient, family and they are in agreement.  CODE STATUS: Full  DVT Prophylaxis: Lovenox  TOTAL TIME TAKING CARE OF THIS PATIENT: 40 minutes.  >50% time spent on counselling and coordination of care  POSSIBLE D/C IN one to 2 DAYS, DEPENDING ON CLINICAL CONDITION.   Maretta Overdorf M.D on 10/02/2015 at 7:34 AM  Between 7am to 6pm - Pager - [phone]  After 6pm go to [email or web address] - password [email or web address] [facility]  [facility]  Office  [phone]  CC: Primary care  physician; No PCP Per Patient

## 2015-10-02 NOTE — Care Management Note (Signed)
Case Management Note  Patient Details  Name: Penny Tucker MRN: [id number] Date of Birth: July 22, 1981  Subjective/Objective:  34yo Penny Tucker was seen y [facility]-CSW Jetta LoutBailey Morgan on 09/30/15 per IV heroin/cocaine use x 6 years, and was provided with a list of substance abuse treatment resources. Penny Tucker resides with her Mother. On 10/02/15 Penny Tucker received a surgical I&D of a right ankle abscess on the OR. Anticipate discharge home with home health. Gave Penny Tucker a list of local home health providers and case management will set up any home health services ordered at time of discharge.                     Action/Plan:   Expected Discharge Date:  10/03/15               Expected Discharge Plan:     In-House Referral:     Discharge planning Services     Post Acute Care Choice:    Choice offered to:     DME Arranged:    DME Agency:     HH Arranged:    HH Agency:     Status of Service:     Medicare Important Message Given:    Date Medicare IM Given:    Medicare IM give by:    Date Additional Medicare IM Given:    Additional Medicare Important Message give by:     If discussed at Long Length of Stay Meetings, dates discussed:    Additional Comments:  Jamari Diana A, RN 10/02/2015, 12:04 PM

## 2015-10-02 NOTE — Consult Note (Signed)
Electrolyte CONSULT NOTE - Follow Up  Pharmacy Consult for Potassium monitoring and replacement   Patient Measurements: Height: 5\' 8"  (172.7 cm) Weight: 150 lb (68.04 kg) IBW/kg (Calculated) : 63.9  Vital Signs: Temp: 97.8 F (36.6 C) (11/27 0737) Temp Source: Oral (11/27 0737) BP: 112/70 mmHg (11/27 0737) Pulse Rate: 68 (11/27 0737)   Labs: Lab Results  Component Value Date   K 3.6 10/02/2015    Estimated Creatinine Clearance: 100.9 mL/min (by C-G formula based on Cr of 0.61).   Assessment: 34 yo female with history of IV drug abuse being treated for cellulitis of the right ankle. Pharmacy consulted for monitoring and replacement of potassium.   K+ 11/25: 2.9 K+ 11/26: 3.5 K+ 11/27: 3.6  Plan:  K+ WNL now. No further replacement is needed. Pharmacy will continue to monitor labs and replace as needed.   Stormy CardKatsoudas,Kaleea Penner K, Domin[facility] HospitalRPH Clinical Pharmacist 10/02/2015 7:51 AM

## 2015-10-02 NOTE — Anesthesia Postprocedure Evaluation (Signed)
Anesthesia Post Note  Patient: Penny Tucker  Procedure(s) Performed: Procedure(s) (LRB): INCISION AND DRAINAGE ABSCESS (Right)  Patient location during evaluation: PACU Anesthesia Type: General Level of consciousness: awake and alert Pain management: pain level controlled Vital Signs Assessment: post-procedure vital signs reviewed and stable Respiratory status: spontaneous breathing, nonlabored ventilation, respiratory function stable and patient connected to nasal cannula oxygen Cardiovascular status: blood pressure returned to baseline and stable Postop Assessment: No signs of nausea or vomiting Anesthetic complications: no    Last Vitals:  Filed Vitals:   10/02/15 0940 10/02/15 1008  BP:  119/78  Pulse: 50 57  Temp:  36.6 C  Resp: 18 16    Last Pain:  Filed Vitals:   10/02/15 1008  PainSc: 6                  Jomarie LongsJoseph K Eulogia Dismore

## 2015-10-03 ENCOUNTER — Encounter: Payer: Self-pay | Admitting: Surgery

## 2015-10-03 LAB — POTASSIUM: Potassium: 4.9 mmol/L (ref 3.5–5.1)

## 2015-10-03 NOTE — Progress Notes (Signed)
ANTIBIOTIC CONSULT NOTE - INITIAL  Pharmacy Consult for vancomycin Indication: RLE cellulitis with abscess  No Known Allergies  Patient Measurements: Height: 5\' 8"  (172.7 cm) Weight: 150 lb (68.04 kg) IBW/kg (Calculated) : 63.9 Adjusted Body Weight: 65.5 kg  Vital Signs: Temp: 97.7 F (36.5 C) (11/28 0815) Temp Source: Oral (11/28 0815) BP: 119/62 mmHg (11/28 0815) Pulse Rate: 59 (11/28 0815) Intake/Output from previous day: 11/27 0701 - 11/28 0700 In: 403 [I.V.:403] Out: 10 [Blood:10] Intake/Output from this shift:    Labs:  Recent Labs  10/01/15 0730 10/01/15 0855  WBC  --  10.9  HGB  --  13.0  PLT  --  407  CREATININE 0.61  --    Estimated Creatinine Clearance: 100.9 mL/min (by C-G formula based on Cr of 0.61).  Recent Labs  10/01/15 0730 10/02/15 1939  VANCOTROUGH 8* 9*     Microbiology: Recent Results (from the past 720 hour(s))  Blood culture (routine x 2)     Status: None (Preliminary result)   Collection Time: 09/30/15  1:35 AM  Result Value Ref Range Status   Specimen Description BLOOD BLOOD LEFT FOREARM  Final   Special Requests BOTTLES DRAWN AEROBIC AND ANAEROBIC 5ML  Final   Culture NO GROWTH 3 DAYS  Final   Report Status PENDING  Incomplete  Blood culture (routine x 2)     Status: None (Preliminary result)   Collection Time: 09/30/15  2:19 AM  Result Value Ref Range Status   Specimen Description BLOOD LEFT ANTECUBITAL  Final   Special Requests BOTTLES DRAWN AEROBIC AND ANAEROBIC 10ML  Final   Culture NO GROWTH 3 DAYS  Final   Report Status PENDING  Incomplete  Wound culture     Status: None (Preliminary result)   Collection Time: 09/30/15  3:53 PM  Result Value Ref Range Status   Specimen Description ABSCESS  Final   Special Requests NONE  Final   Gram Stain   Final    MODERATE WBC SEEN FEW GRAM POSITIVE COCCI IN CLUSTERS RARE GRAM NEGATIVE RODS    Culture   Final    MODERATE GROWTH METHICILLIN RESISTANT STAPHYLOCOCCUS  AUREUS CRITICAL RESULT CALLED TO, READ BACK BY AND VERIFIED WITH: ALANA HODGES AT 1118 10/02/15 DV    Report Status PENDING  Incomplete   Organism ID, Bacteria METHICILLIN RESISTANT STAPHYLOCOCCUS AUREUS  Final      Susceptibility   Methicillin resistant staphylococcus aureus - MIC*    CIPROFLOXACIN >=8 RESISTANT Resistant     GENTAMICIN <=0.5 SENSITIVE Sensitive     OXACILLIN >=4 RESISTANT Resistant     VANCOMYCIN 1 SENSITIVE Sensitive     TRIMETH/SULFA <=10 SENSITIVE Sensitive     CEFOXITIN SCREEN Value in next row Resistant      POSITIVECEFOXITIN SCREEN - This test may be used to predict mecA-mediated oxacillin resistance, and it is based on the cefoxitin disk screen test.  The cefoxitin screen and oxacillin work in combination to determine the final interpretation reported for oxacillin.     Inducible Clindamycin Value in next row Sensitive      POSITIVECEFOXITIN SCREEN - This test may be used to predict mecA-mediated oxacillin resistance, and it is based on the cefoxitin disk screen test.  The cefoxitin screen and oxacillin work in combination to determine the final interpretation reported for oxacillin.     TETRACYCLINE Value in next row Sensitive      SENSITIVE<=1    * MODERATE GROWTH METHICILLIN RESISTANT STAPHYLOCOCCUS AUREUS  MRSA PCR Screening  Status: Abnormal   Collection Time: 10/01/15  5:30 AM  Result Value Ref Range Status   MRSA by PCR POSITIVE (A) NEGATIVE Final    Comment:        The GeneXpert MRSA Assay (FDA approved for NASAL specimens only), is one component of a comprehensive MRSA colonization surveillance program. It is not intended to diagnose MRSA infection nor to guide or monitor treatment for MRSA infections. CRITICAL RESULT CALLED TO, READ BACK BY AND VERIFIED WITH:  Webb Laws AT 0740 10/01/15 SDR    Assessment: Pharmacy consulted to dose vancomycin for abscess/cellulitis in this 34 year old female.  Wound culture - MRSA sensitive to bactrim    Goal of Therapy:  Vancomycin trough level 15-20 mcg/ml  Plan:  Current orders for vancomycin 1gm IV q8H. Planning to change to bactrim tomorrow morning, thus will cancel trough for tomorrow. Check creatinine with AM labs  Pharmacy to follow per consult  Garlon Hatchet, PharmD Clinical Pharmacist  10/03/2015 2:26 PM

## 2015-10-03 NOTE — Progress Notes (Signed)
  Subjective: 1 Day Post-Op Procedure(s) (LRB): INCISION AND DRAINAGE ABSCESS (Right) Patient reports pain as 7 on 0-10 scale.   Patient is well, and has had no acute complaints or problems Plan is to go Home after hospital stay. Negative for chest pain and shortness of breath Fever: no Gastrointestinal:Negative for nausea and vomiting  Objective: Vital signs in last 24 hours: Temp:  [97 F (36.1 C)-98.6 F (37 C)] 98.1 F (36.7 C) (11/28 0341) Pulse Rate:  [50-108] 57 (11/28 0341) Resp:  [16-19] 18 (11/28 0341) BP: (98-143)/(56-104) 100/62 mmHg (11/28 0341) SpO2:  [97 %-100 %] 100 % (11/28 0341)  Intake/Output from previous day:  Intake/Output Summary (Last 24 hours) at 10/03/15 0747 Last data filed at 10/02/15 2228  Gross per 24 hour  Intake    403 ml  Output     10 ml  Net    393 ml    Intake/Output this shift:    Labs:  Recent Labs  10/01/15 0855  HGB 13.0    Recent Labs  10/01/15 0855  WBC 10.9  RBC 4.39  HCT 39.0  PLT 407    Recent Labs  10/01/15 0730 10/02/15 0446 10/03/15 0533  NA 140  --   --   K 3.5 3.6 4.9  CL 109  --   --   CO2 26  --   --   BUN 6  --   --   CREATININE 0.61  --   --   GLUCOSE 97  --   --   CALCIUM 8.9  --   --    No results for input(s): LABPT, INR in the last 72 hours.   EXAM General - Patient is Alert, Appropriate and Oriented Extremity - Neurologically intact ABD soft Sensation intact distally Dorsiflexion/Plantar flexion intact Incision: moderate drainage Dressing/Incision - erythema, tender, blood tinged drainage Motor Function - intact, moving foot and toes well on exam.   The bulky dressing was removed today and the primrose drain was pulled back 1-2 cm before a fresh bulky dressing was applied to the right ankle wound.  The wound is still erythematous, but the area of erythema is 4cm in diameter around the wound, which is less than when she first presented to hospital.   Past Medical History   Diagnosis Date  . Drug use     Assessment/Plan: 1 Day Post-Op Procedure(s) (LRB): INCISION AND DRAINAGE ABSCESS (Right) Principal Problem:   Ankle abscess Active Problems:   Cellulitis of right ankle  Estimated body mass index is 22.81 kg/(m^2) as calculated from the following:   Height as of this encounter: 5\' 8"  (1.727 m).   Weight as of this encounter: 68.04 kg (150 lb). Advance Diet Up with Therapy Continue IV Abx Bulky dressing changed today, plan on primrose drain removal tomorrow morning.  DVT Prophylaxis - Lovenox Non-weightbearing to the right lower extremity.  Valeria BatmanJ. Lance Kingstyn Deruiter, PA-C [facility] 10/03/2015, 7:47 AM

## 2015-10-03 NOTE — Progress Notes (Signed)
Patient ID: ASHETON VIRAMONTES, female   DOB: Dec 11, 1980, 34 y.o.   MRN: [id number] [facility] - [place] at [facility]   PATIENT NAME: Penny Tucker    MR#:  [id number]  DATE OF BIRTH:  [birth year]  SUBJECTIVE:   POD #1  REVIEW OF SYSTEMS:   Review of Systems  Constitutional: Negative for fever, chills and weight loss.  HENT: Negative for ear discharge, ear pain and nosebleeds.   Eyes: Negative for blurred vision, pain and discharge.  Respiratory: Negative for sputum production, shortness of breath, wheezing and stridor.   Cardiovascular: Negative for chest pain, palpitations, orthopnea and PND.  Gastrointestinal: Negative for nausea, vomiting, abdominal pain and diarrhea.  Genitourinary: Negative for urgency and frequency.  Musculoskeletal: Positive for joint pain. Negative for back pain.  Skin:       Swelling over the right ankle with pain  Neurological: Positive for weakness. Negative for sensory change, speech change and focal weakness.  Psychiatric/Behavioral: Negative for depression and hallucinations. The patient is not nervous/anxious.    Tolerating Diet:yes Tolerating PT: crutches  DRUG ALLERGIES:  No Known Allergies  VITALS:  Blood pressure 100/62, pulse 57, temperature 98.1 F (36.7 C), temperature source Oral, resp. rate 18, height  (1.727 m), weight 150 lb (68.04 kg), last menstrual period 09/30/2015, SpO2 100 %.  PHYSICAL EXAMINATION:   Physical Exam  GENERAL:  34 y.o.-year-old patient lying in the bed with no acute distress.  EYES: Pupils equal, round, reactive to light and accommodation. No scleral icterus. Extraocular muscles intact.  HEENT: Head atraumatic, normocephalic. Oropharynx and nasopharynx clear.  NECK:  Supple, no jugular venous distention. No thyroid enlargement, no tenderness.  LUNGS: Normal breath sounds bilaterally, no wheezing, rales, rhonchi. No use of accessory muscles of respiration.  CARDIOVASCULAR: S1, S2  normal. No murmurs, rubs, or gallops.  ABDOMEN: Soft, nontender, nondistended. Bowel sounds present. No organomegaly or mass.  EXTREMITIES: No cyanosis, clubbing or edema b/l. Surgical dressing + NEUROLOGIC: Cranial nerves II through XII are intact. No focal Motor or sensory deficits b/l.   PSYCHIATRIC: The patient is alert and oriented x 3.  SKIN: No obvious rash, lesion, or ulcer.    LABORATORY PANEL:   CBC  Recent Labs Lab 10/01/15 0855  WBC 10.9  HGB 13.0  HCT 39.0  PLT 407    Chemistries   Recent Labs Lab 10/01/15 0730  10/03/15 0533  NA 140  --   --   K 3.5  < > 4.9  CL 109  --   --   CO2 26  --   --   GLUCOSE 97  --   --   BUN 6  --   --   CREATININE 0.61  --   --   CALCIUM 8.9  --   --   < > = values in this interval not displayed.   ASSESSMENT AND PLAN:   1. Right ankle cellulitis and Abscess status post I&D with packing in the emergency room.  Continue IV Vanco ---> change to po bactrim in am [facility] MRSA Orthopedic consultation with Dr. Joice Lofts.noted. S/p I and d on 10/02/15. Has drain +  2. Right ankle pain As needed morphine and po norco  3. Hypokalemia Repleted  4.h/o  IV drug abuse  -UDS positive for cocaine on admission  Anticipate d/c in am. May need [facility] for wound care  Case discussed with Care Management/Social Worker. Management plans discussed with the patientand in agreement.  CODE STATUS: Full  DVT Prophylaxis: Lovenox  TOTAL TIME TAKING CARE OF THIS PATIENT: 40 minutes.  >50% time spent on counselling and coordination of care pt, RN, Dr Joice LoftsPoggi  POSSIBLE D/C IN one to 2 DAYS, DEPENDING ON CLINICAL CONDITION.   Penny Tucker M.D on 10/03/2015 at 7:48 AM  Between 7am to 6pm - Pager - [phone]  After 6pm go to [email or web address] - password [email or web address] [facility]  [facility]  Office  [phone]  CC: Primary care physician; No PCP Per Patient

## 2015-10-03 NOTE — Progress Notes (Signed)
Clinical Child psychotherapistocial Worker (CSW) received SNF consult. PT recommended "no PT follow up". Please reconsult if future social work needs arise. CSW signing off.   Jetta LoutBailey Morgan, LCSWA [phone]

## 2015-10-03 NOTE — Consult Note (Signed)
Electrolyte CONSULT NOTE - Follow Up  Pharmacy Consult for Potassium monitoring and replacement   Patient Measurements: Height: 5\' 8"  (172.7 cm) Weight: 150 lb (68.04 kg) IBW/kg (Calculated) : 63.9  Vital Signs: Temp: 97.7 F (36.5 C) (11/28 0815) Temp Source: Oral (11/28 0815) BP: 119/62 mmHg (11/28 0815) Pulse Rate: 59 (11/28 0815)   Labs: Lab Results  Component Value Date   K 4.9 10/03/2015    Estimated Creatinine Clearance: 100.9 mL/min (by C-G formula based on Cr of 0.61).   Assessment: 34 yo female with history of IV drug abuse being treated for cellulitis of the right ankle. Pharmacy consulted for monitoring and replacement of potassium.   Plan:  K+ WNL x 3 days. No supplementation needed, will discontinue consult  Garlon HatchetJody Jniyah Dantuono, PharmD Clinical Pharmacist  10/03/2015 2:27 PM

## 2015-10-03 NOTE — Progress Notes (Signed)
Spoke with patient.  She had taken medication and was trying to sleep.  I just told her to get some rest and we'd see her another time. Chaplain Ronney Liononna S Wille Aubuchon Ext [phone]

## 2015-10-03 NOTE — Progress Notes (Signed)
Physical Therapy Treatment Patient Details Name: MARLITA KEIL MRN: [id number] DOB: 07-25-1981 Today's Date: 10/03/2015    History of Present Illness Pt with R ankle infection with I&D 11/27    PT Comments    Pt agreeable to PT and steps this morning. Pt demonstrates good understanding of ambulation and stair climbing with crutches; however, cues for decreased speed and more controlled movements for greater safety/avoid LOB/fall. Pt tends to move with increased speed and take large, mildly uncontrolled hops with need to stop and stabilize, which pt can do without assist. Also demonstrates several hops large hops sans assistive device, which was discouraged by PT again for improved safety/fall prevention. Pt notes stairs at home have very short rise, which will make hopping easier; pt did manage average rise step with Min a to steady only. Continue PT for progression of quality of ambulation with crutches, stair climbing and safety for optimal safe return home.   Follow Up Recommendations  No PT follow up     Equipment Recommendations  Crutches    Recommendations for Other Services       Precautions / Restrictions Precautions Precautions: None Restrictions Weight Bearing Restrictions: Yes RLE Weight Bearing: Non weight bearing    Mobility  Bed Mobility Overal bed mobility: Independent                Transfers Overall transfer level: Independent ( ) Equipment used: Rolling walker (2 wheeled);Crutches             General transfer comment: Pt cued to use assistive device fully until destination reached, as pt abandones at times and reaches with large hop to bed, and more controlled movements to ensure safety. No LOB noted.   Ambulation/Gait Ambulation/Gait assistance: Min guard Ambulation Distance (Feet): 60 Feet (to/from bathroom with rw) Assistive device: Crutches     Gait velocity interpretation: at or above normal speed for age/gender General Gait Details:  Dues for slower, possibly decreased step with LLE for more control/greater safety to decrease risk of LOB   Stairs Stairs: Yes Stairs assistance: Min assist Stair Management: One rail Left (one crutch in RUE) Number of Stairs: 6 General stair comments: Pt requires Min A to steady; pts steps at home with much less rise per step  Wheelchair Mobility    Modified Rankin (Stroke Patients Only)       Balance                                    Cognition Arousal/Alertness: Awake/alert Behavior During Therapy: WFL for tasks assessed/performed Overall Cognitive Status: Within Functional Limits for tasks assessed                      Exercises      General Comments        Pertinent Vitals/Pain Pain Assessment: 0-10 Pain Score: 8  Pain Location: R foot Pain Descriptors / Indicators: Throbbing Pain Intervention(s): Premedicated before session;Other (comment) (Elevated post treatment)    Home Living                      Prior Function            PT Goals (current goals can now be found in the care plan section) Progress towards PT goals: Progressing toward goals    Frequency  7X/week    PT Plan Current plan remains appropriate  Co-evaluation             End of Session Equipment Utilized During Treatment: Gait belt Activity Tolerance: Patient tolerated treatment well Patient left: in bed;with call bell/phone within reach;with bed alarm set     Time: [id number] PT Time Calculation (min) (ACUTE ONLY): 25 min  Charges:  $Gait Training: 23-37 mins                    G Codes:      Kristeen MissHeidi Elizabeth Bishop 10/03/2015, 11:22 AM

## 2015-10-04 LAB — CREATININE, SERUM
CREATININE: 0.76 mg/dL (ref 0.44–1.00)
GFR calc Af Amer: >60 mL/min (ref >60)
GFR calc non Af Amer: >60 mL/min (ref >60)

## 2015-10-04 MED ORDER — SULFAMETHOXAZOLE-TRIMETHOPRIM 800-160 MG PO TABS: 1.0000 | ORAL_TABLET | Freq: Two times a day (BID) | ORAL | Status: DC

## 2015-10-04 MED ORDER — OXYCODONE-ACETAMINOPHEN 5-325 MG PO TABS: 1.0000 | ORAL_TABLET | ORAL | Status: DC | PRN

## 2015-10-04 MED ORDER — OXYCODONE HCL 5 MG PO TABS: 5.0000 mg | ORAL_TABLET | ORAL | Status: DC | PRN

## 2015-10-04 NOTE — Progress Notes (Signed)
Discharge instructions and prescriptions given to patient.  Pt to call and make Appointment with Dr. Joice LoftsPoggi.

## 2015-10-04 NOTE — Progress Notes (Signed)
Physical Therapy Treatment Patient Details Name: ZARIAH CAVENDISH MRN: [id number] DOB: November 24, 1980 Today's Date: 10/04/2015    History of Present Illness Pt with R ankle infection with I&D 11/27    PT Comments    Pt demonstrating improved gait quality with crutches and improved distance. Pt only limited in distance by increasing throbbing pain in right foot in dependent position. Improved safety with transfers. Pt instructed in left lower extremity resistive exercises with handout and Thera-band provided and active range of motion exercises for right lower extremity. Continue PT to progress ambulation quality and distance with less assist for optimal safe return home post hospital stay.   Follow Up Recommendations  No PT follow up     Equipment Recommendations  Crutches    Recommendations for Other Services       Precautions / Restrictions Precautions Precautions: None Restrictions Weight Bearing Restrictions: Yes RLE Weight Bearing: Non weight bearing    Mobility  Bed Mobility Overal bed mobility: Independent                Transfers Overall transfer level: Independent Equipment used: Crutches             General transfer comment: Improved safety with transfer and bed approach to sit with crutches vs rw, which she tends to abandon  Ambulation/Gait Ambulation/Gait assistance: Min guard Ambulation Distance (Feet): 110 Feet Assistive device: Crutches Gait Pattern/deviations:  (swing through pattern with LLE)   Gait velocity interpretation: at or above normal speed for age/gender General Gait Details: Initial cues for controlled smaller swing through on left with good compliance. Crutches readjusted for improved fit and noted better able to control crutches. Pt ambulation only limited by throbbing pain in RLE in dependent position.    Stairs            Wheelchair Mobility    Modified Rankin (Stroke Patients Only)       Balance Overall balance  assessment: No apparent balance deficits (not formally assessed)                                  Cognition Arousal/Alertness: Awake/alert (Initially sleeping, but easily awoken ) Behavior During Therapy: WFL for tasks assessed/performed Overall Cognitive Status: Within Functional Limits for tasks assessed                      Exercises Other Exercises Other Exercises: Pt educated on LLE with resistive banc and given written program. Also educated pt on RLE arom bed exercises to avoid stiffness and maintain strength for isometrics and hip/knee all plances.    General Comments        Pertinent Vitals/Pain Pain Assessment: 0-10 Pain Score: 8  Pain Location: R foot Pain Descriptors / Indicators: Throbbing Pain Intervention(s): Premedicated before session;Limited activity within patient's tolerance    Home Living                      Prior Function            PT Goals (current goals can now be found in the care plan section) Progress towards PT goals: Progressing toward goals    Frequency  7X/week    PT Plan Current plan remains appropriate    Co-evaluation             End of Session Equipment Utilized During Treatment: Gait belt Activity Tolerance: Patient tolerated treatment well;Patient  limited by pain (R foot throbs greater in dependent position) Patient left: in bed;with call bell/phone within reach;with bed alarm set     Time: [id number] PT Time Calculation (min) (ACUTE ONLY): 25 min  Charges:  $Gait Training: 8-22 mins $Therapeutic Exercise: 8-22 mins                    G Codes:      Kristeen MissHeidi Elizabeth Bishop 10/04/2015, 10:13 AM

## 2015-10-04 NOTE — Discharge Summary (Signed)
[facility] [place] [facility]   PATIENT NAME: Penny Tucker    MR#:  [id number]  DATE OF BIRTH:  September 02, 1981  DATE OF ADMISSION:  09/30/2015 ADMITTING PHYSICIAN: Ihor AustinPavan Pyreddy, MD  DATE OF DISCHARGE: 10/04/15  PRIMARY CARE PHYSICIAN: No PCP Per Patient    ADMISSION DIAGNOSIS:  Hypokalemia [E87.6] Cellulitis of right lower extremity [L03.115] Abscess of bursa of ankle, right [M71.071]  DISCHARGE DIAGNOSIS:  MRSA right ankle abcess s/p I and D Cocaine abuse  SECONDARY DIAGNOSIS:   Past Medical History  Diagnosis Date  . Drug use     HOSPITAL COURSE:   1. Right ankle cellulitis and Abscess status post I&D on 10/02/15  Continue IV Vanco ---> change to po bactrim  WC MRSA Orthopedic consultation with Dr. Joice LoftsPoggi.noted. S/p I and d on 10/02/15. Drain removed  2. Right ankle pain  po norco  3. Hypokalemia Repleted  4.h/o IV drug abuse  -UDS positive for cocaine on admission  Overall stable for d/c CONSULTS OBTAINED:  Treatment Team:  Christena FlakeJohn J Poggi, MD  DRUG ALLERGIES:  No Known Allergies  DISCHARGE MEDICATIONS:   Current Discharge Medication List    START taking these medications   Details  oxyCODONE-acetaminophen (ROXICET) 5-325 MG tablet Take 1 tablet by mouth every 4 (four) hours as needed for severe pain. Qty: 30 tablet, Refills: 0    sulfamethoxazole-trimethoprim (BACTRIM DS,SEPTRA DS) 800-160 MG tablet Take 1 tablet by mouth every 12 (twelve) hours. Qty: 14 tablet, Refills: 0      STOP taking these medications     clindamycin (CLEOCIN) 300 MG capsule         If you experience worsening of your admission symptoms, develop shortness of breath, life threatening emergency, suicidal or homicidal thoughts you must seek medical attention immediately by calling 911 or calling your MD immediately  if symptoms less severe.  You Must read complete instructions/literature along with all the possible adverse reactions/side  effects for all the Medicines you take and that have been prescribed to you. Take any new Medicines after you have completely understood and accept all the possible adverse reactions/side effects.   Please note  You were cared for by a hospitalist during your hospital stay. If you have any questions about your discharge medications or the care you received while you were in the hospital after you are discharged, you can call the unit and asked to speak with the hospitalist on call if the hospitalist that took care of you is not available. Once you are discharged, your primary care physician will handle any further medical issues. Please note that NO REFILLS for any discharge medications will be authorized once you are discharged, as it is imperative that you return to your primary care physician (or establish a relationship with a primary care physician if you do not have one) for your aftercare needs so that they can reassess your need for medications and monitor your lab values. Today   SUBJECTIVE   Doing well  VITAL SIGNS:  Blood pressure 99/65, pulse 57, temperature 98.2 F (36.8 C), temperature source Oral, resp. rate 18, height 5\' 8"  (1.727 m), weight 150 lb (68.04 kg), last menstrual period 09/30/2015, SpO2 100 %.  I/O:   Intake/Output Summary (Last 24 hours) at 10/04/15 1121 Last data filed at 10/04/15 0506  Gross per 24 hour  Intake    800 ml  Output      0 ml  Net    800 ml  PHYSICAL EXAMINATION:  GENERAL:  34 y.o.-year-old patient lying in the bed with no acute distress.  EYES: Pupils equal, round, reactive to light and accommodation. No scleral icterus. Extraocular muscles intact.  HEENT: Head atraumatic, normocephalic. Oropharynx and nasopharynx clear.  NECK:  Supple, no jugular venous distention. No thyroid enlargement, no tenderness.  LUNGS: Normal breath sounds bilaterally, no wheezing, rales,rhonchi or crepitation. No use of accessory muscles of respiration.   CARDIOVASCULAR: S1, S2 normal. No murmurs, rubs, or gallops.  ABDOMEN: Soft, non-tender, non-distended. Bowel sounds present. No organomegaly or mass.  EXTREMITIES: No pedal edema, cyanosis, or clubbing. Right ankle dressing+ NEUROLOGIC: Cranial nerves II through XII are intact. Muscle strength 5/5 in all extremities. Sensation intact. Gait not checked.  PSYCHIATRIC: The patient is alert and oriented x 3.  SKIN: No obvious rash, lesion, or ulcer.   DATA REVIEW:   CBC   Recent Labs Lab 10/01/15 0855  WBC 10.9  HGB 13.0  HCT 39.0  PLT 407    Chemistries   Recent Labs Lab 10/01/15 0730  10/03/15 0533 10/04/15 0540  NA 140  --   --   --   K 3.5  < > 4.9  --   CL 109  --   --   --   CO2 26  --   --   --   GLUCOSE 97  --   --   --   BUN 6  --   --   --   CREATININE 0.61  --   --  0.76  CALCIUM 8.9  --   --   --   < > = values in this interval not displayed.  Microbiology Results   Recent Results (from the past 240 hour(s))  Blood culture (routine x 2)     Status: None (Preliminary result)   Collection Time: 09/30/15  1:35 AM  Result Value Ref Range Status   Specimen Description BLOOD BLOOD LEFT FOREARM  Final   Special Requests BOTTLES DRAWN AEROBIC AND ANAEROBIC  Final   Culture NO GROWTH 3 DAYS  Final   Report Status PENDING  Incomplete  Blood culture (routine x 2)     Status: None (Preliminary result)   Collection Time: 09/30/15  2:19 AM  Result Value Ref Range Status   Specimen Description BLOOD LEFT ANTECUBITAL  Final   Special Requests BOTTLES DRAWN AEROBIC AND ANAEROBIC  Final   Culture NO GROWTH 3 DAYS  Final   Report Status PENDING  Incomplete  Wound culture     Status: None (Preliminary result)   Collection Time: 09/30/15  3:53 PM  Result Value Ref Range Status   Specimen Description ABSCESS  Final   Special Requests NONE  Final   Gram Stain   Final    MODERATE WBC SEEN FEW GRAM POSITIVE COCCI IN CLUSTERS RARE GRAM NEGATIVE RODS     Culture   Final    MODERATE GROWTH METHICILLIN RESISTANT STAPHYLOCOCCUS AUREUS CRITICAL RESULT CALLED TO, READ BACK BY AND VERIFIED WITH: ALANA HODGES AT 1118 10/02/15 DV HOLDING FOR ADDITIONAL POSSIBLE PATHOGEN    Report Status PENDING  Incomplete   Organism ID, Bacteria METHICILLIN RESISTANT STAPHYLOCOCCUS AUREUS  Final      Susceptibility   Methicillin resistant staphylococcus aureus - MIC*    CIPROFLOXACIN >=8 RESISTANT Resistant     GENTAMICIN <=0.5 SENSITIVE Sensitive     OXACILLIN >=4 RESISTANT Resistant     VANCOMYCIN 1 SENSITIVE Sensitive     TRIMETH/SULFA <=10  SENSITIVE Sensitive     CEFOXITIN SCREEN Value in next row Resistant      POSITIVECEFOXITIN SCREEN - This test may be used to predict mecA-mediated oxacillin resistance, and it is based on the cefoxitin disk screen test.  The cefoxitin screen and oxacillin work in combination to determine the final interpretation reported for oxacillin.     Inducible Clindamycin Value in next row Sensitive      POSITIVECEFOXITIN SCREEN - This test may be used to predict mecA-mediated oxacillin resistance, and it is based on the cefoxitin disk screen test.  The cefoxitin screen and oxacillin work in combination to determine the final interpretation reported for oxacillin.     TETRACYCLINE Value in next row Sensitive      SENSITIVE<=1    * MODERATE GROWTH METHICILLIN RESISTANT STAPHYLOCOCCUS AUREUS  MRSA PCR Screening     Status: Abnormal   Collection Time: 10/01/15  5:30 AM  Result Value Ref Range Status   MRSA by PCR POSITIVE (A) NEGATIVE Final    Comment:        The GeneXpert MRSA Assay (FDA approved for NASAL specimens only), is one component of a comprehensive MRSA colonization surveillance program. It is not intended to diagnose MRSA infection nor to guide or monitor treatment for MRSA infections. CRITICAL RESULT CALLED TO, READ BACK BY AND VERIFIED WITH:  JADIE COHEN AT 0740 10/01/15 SDR     RADIOLOGY:  No results  found.   Management plans discussed with the patient, family and they are in agreement.  CODE STATUS:     Code Status Orders        Start     Ordered   10/02/15 1048  Full code   Continuous     10/02/15 1047      TOTAL TIME TAKING CARE OF THIS PATIENT: 40 minutes.    Camran Keady M.D on 10/04/2015 at 11:21 AM  Between 7am to 6pm - Pager - [phone] After 6pm go to [email or web address] - password [email or web address] [facility]  [facility]  Office  [phone]  CC: Primary care physician; No PCP Per Patient

## 2015-10-04 NOTE — Progress Notes (Signed)
  Subjective: 2 Days Post-Op Procedure(s) (LRB): INCISION AND DRAINAGE ABSCESS (Right) Patient reports pain as 7 on 0-10 scale.   Patient is well, and has had no acute complaints or problems Plan is to go Home after hospital stay. Negative for chest pain and shortness of breath Fever: no Gastrointestinal:Negative for nausea and vomiting  Objective: Vital signs in last 24 hours: Temp:  [97.7 F (36.5 C)-98.4 F (36.9 C)] 97.9 F (36.6 C) (11/29 0530) Pulse Rate:  [51-72] 51 (11/29 0530) Resp:  [18] 18 (11/29 0530) BP: (100-119)/(62-71) 100/71 mmHg (11/29 0530) SpO2:  [98 %-99 %] 99 % (11/29 0530)  Intake/Output from previous day:  Intake/Output Summary (Last 24 hours) at 10/04/15 0752 Last data filed at 10/04/15 0506  Gross per 24 hour  Intake    800 ml  Output      0 ml  Net    800 ml    Intake/Output this shift:    Labs:  Recent Labs  10/01/15 0855  HGB 13.0    Recent Labs  10/01/15 0855  WBC 10.9  RBC 4.39  HCT 39.0  PLT 407    Recent Labs  10/02/15 0446 10/03/15 0533 10/04/15 0540  K 3.6 4.9  --   CREATININE  --   --  0.76   No results for input(s): LABPT, INR in the last 72 hours.   EXAM General - Patient is Alert, Appropriate and Oriented Extremity - Neurologically intact ABD soft Sensation intact distally Dorsiflexion/Plantar flexion intact Incision: moderate drainage Dressing/Incision - erythema, tender, blood tinged drainage Motor Function - intact, moving foot and toes well on exam.   The bulky dressing was removed today and the primrose drain was completely removed today.  The wound is less erythematous today, area of around 3cm in diameter around the wound. Drainage less this AM, bloody with mild purulent drainage, soaking in the 4x4 in about a 1cm diameter.  Past Medical History  Diagnosis Date  . Drug use     Assessment/Plan: 2 Days Post-Op Procedure(s) (LRB): INCISION AND DRAINAGE ABSCESS (Right) Principal Problem:  Ankle abscess Active Problems:   Cellulitis of right ankle  Estimated body mass index is 22.81 kg/(m^2) as calculated from the following:   Height as of this encounter: 5\' 8"  (1.727 m).   Weight as of this encounter: 68.04 kg (150 lb). Advance Diet Up with Therapy, will discharge patient with crutches and CAM walker boot. Continue IV Abx, switch over to PO today or tomorrow. Bulky dressing changed today, Primrose drain removed completely today.  Follow-up with [facility] on either Friday (12/2) or Monday (12/5) for a skin check. Suture removal next Friday (10/14/15)  DVT Prophylaxis - Lovenox Non-weightbearing to the right lower extremity.  Valeria BatmanJ. Lance Antione Obar, PA-C [facility] 10/04/2015, 7:52 AM

## 2015-10-04 NOTE — Discharge Instructions (Signed)
Dressing changes per instructions °

## 2015-10-05 LAB — CULTURE, BLOOD (ROUTINE X 2)
Culture: NO GROWTH
Culture: NO GROWTH

## 2015-10-05 LAB — WOUND CULTURE

## 2015-10-17 ENCOUNTER — Encounter (HOSPITAL_COMMUNITY): Payer: Self-pay

## 2015-10-17 ENCOUNTER — Emergency Department (HOSPITAL_COMMUNITY)
Admission: EM | Admit: 2015-10-17 | Discharge: 2015-10-17 | Disposition: A | Discharge status: Home / Self Care | Payer: Medicaid Other | Attending: Emergency Medicine | Admitting: Emergency Medicine

## 2015-10-17 DIAGNOSIS — F17210 Nicotine dependence, cigarettes, uncomplicated: Secondary | ICD-10-CM | POA: Insufficient documentation

## 2015-10-17 DIAGNOSIS — M25571 Pain in right ankle and joints of right foot: Secondary | ICD-10-CM

## 2015-10-17 DIAGNOSIS — L03115 Cellulitis of right lower limb: Secondary | ICD-10-CM | POA: Insufficient documentation

## 2015-10-17 MED ORDER — OXYCODONE-ACETAMINOPHEN 5-325 MG PO TABS
1.0000 | ORAL_TABLET | Freq: Once | ORAL | Status: AC
  Administered 2015-10-17: 1 via ORAL
  Filled 2015-10-17: qty 1

## 2015-10-17 MED ORDER — VANCOMYCIN HCL IN DEXTROSE 1-5 GM/200ML-% IV SOLN
1000.0000 mg | Freq: Once | INTRAVENOUS | Status: AC
  Administered 2015-10-17: 1000 mg via INTRAVENOUS
  Filled 2015-10-17: qty 200

## 2015-10-17 MED ORDER — DOXYCYCLINE HYCLATE 100 MG PO CAPS: 100.0000 mg | ORAL_CAPSULE | Freq: Two times a day (BID) | ORAL | Status: DC

## 2015-10-17 MED ORDER — OXYCODONE-ACETAMINOPHEN 5-325 MG PO TABS: 1.0000 | ORAL_TABLET | ORAL | Status: DC | PRN

## 2015-10-17 NOTE — ED Notes (Signed)
Pt has been sleeping in the lobby and was woke up and told to leave or check in. Pt reports she has been having right ankle pain and states she was going to go to [place] where she had surgery on her ankle.

## 2015-10-17 NOTE — ED Notes (Signed)
Vancomycin still running.

## 2015-10-17 NOTE — Discharge Instructions (Signed)
Cellulitis Cellulitis is an infection of the skin and the tissue beneath it. The infected area is usually red and tender. Cellulitis occurs most often in the arms and lower legs.  CAUSES  Cellulitis is caused by bacteria that enter the skin through cracks or cuts in the skin. The most common types of bacteria that cause cellulitis are staphylococci and streptococci. SIGNS AND SYMPTOMS   Redness and warmth.  Swelling.  Tenderness or pain.  Fever. DIAGNOSIS  Your health care provider can usually determine what is wrong based on a physical exam. Blood tests may also be done. TREATMENT  Treatment usually involves taking an antibiotic medicine. HOME CARE INSTRUCTIONS   Take your antibiotic medicine as directed by your health care provider. Finish the antibiotic even if you start to feel better.  Keep the infected arm or leg elevated to reduce swelling.  Apply a warm cloth to the affected area up to 4 times per day to relieve pain.  Take medicines only as directed by your health care provider.  Keep all follow-up visits as directed by your health care provider. SEEK MEDICAL CARE IF:   You notice red streaks coming from the infected area.  Your red area gets larger or turns dark in color.  Your bone or joint underneath the infected area becomes painful after the skin has healed.  Your infection returns in the same area or another area.  You notice a swollen bump in the infected area.  You develop new symptoms.  You have a fever. SEEK IMMEDIATE MEDICAL CARE IF:   You feel very sleepy.  You develop vomiting or diarrhea.  You have a general ill feeling (malaise) with muscle aches and pains.   This information is not intended to replace advice given to you by your health care provider. Make sure you discuss any questions you have with your health care provider.   Document Released: 08/01/2005 Document Revised: 07/13/2015 Document Reviewed: 01/07/2012 Elsevier Interactive  Patient Education 2016 [employer]  Doxycycline tablets or capsules What is this medicine? DOXYCYCLINE (dox i SYE kleen) is a tetracycline antibiotic. It kills certain bacteria or stops their growth. It is used to treat many kinds of infections, like dental, skin, respiratory, and urinary tract infections. It also treats acne, Lyme disease, malaria, and certain sexually transmitted infections. This medicine may be used for other purposes; ask your health care provider or pharmacist if you have questions. What should I tell my health care provider before I take this medicine? They need to know if you have any of these conditions: -liver disease -long exposure to sunlight like working outdoors -stomach problems like colitis -an unusual or allergic reaction to doxycycline, tetracycline antibiotics, other medicines, foods, dyes, or preservatives -pregnant or trying to get pregnant -breast-feeding How should I use this medicine? Take this medicine by mouth with a full glass of water. Follow the directions on the prescription label. It is best to take this medicine without food, but if it upsets your stomach take it with food. Take your medicine at regular intervals. Do not take your medicine more often than directed. Take all of your medicine as directed even if you think you are better. Do not skip doses or stop your medicine early. Talk to your pediatrician regarding the use of this medicine in children. While this drug may be prescribed for selected conditions, precautions do apply. Overdosage: If you think you have taken too much of this medicine contact a poison control center or emergency room  at once. NOTE: This medicine is only for you. Do not share this medicine with others. What if I miss a dose? If you miss a dose, take it as soon as you can. If it is almost time for your next dose, take only that dose. Do not take double or extra doses. What may interact with this  medicine? -antacids -barbiturates -birth control pills -bismuth subsalicylate -carbamazepine -methoxyflurane -other antibiotics -phenytoin -vitamins that contain iron -warfarin This list may not describe all possible interactions. Give your health care provider a list of all the medicines, herbs, non-prescription drugs, or dietary supplements you use. Also tell them if you smoke, drink alcohol, or use illegal drugs. Some items may interact with your medicine. What should I watch for while using this medicine? Tell your doctor or health care professional if your symptoms do not improve. Do not treat diarrhea with over the counter products. Contact your doctor if you have diarrhea that lasts more than 2 days or if it is severe and watery. Do not take this medicine just before going to bed. It may not dissolve properly when you lay down and can cause pain in your throat. Drink plenty of fluids while taking this medicine to also help reduce irritation in your throat. This medicine can make you more sensitive to the sun. Keep out of the sun. If you cannot avoid being in the sun, wear protective clothing and use sunscreen. Do not use sun lamps or tanning beds/booths. Birth control pills may not work properly while you are taking this medicine. Talk to your doctor about using an extra method of birth control. If you are being treated for a sexually transmitted infection, avoid sexual contact until you have finished your treatment. Your sexual partner may also need treatment. Avoid antacids, aluminum, calcium, magnesium, and iron products for 4 hours before and 2 hours after taking a dose of this medicine. If you are using this medicine to prevent malaria, you should still protect yourself from contact with mosquitos. Stay in screened-in areas, use mosquito nets, keep your body covered, and use an insect repellent. What side effects may I notice from receiving this medicine? Side effects that you  should report to your doctor or health care professional as soon as possible: -allergic reactions like skin rash, itching or hives, swelling of the face, lips, or tongue -difficulty breathing -fever -itching in the rectal or genital area -pain on swallowing -redness, blistering, peeling or loosening of the skin, including inside the mouth -severe stomach pain or cramps -unusual bleeding or bruising -unusually weak or tired -yellowing of the eyes or skin Side effects that usually do not require medical attention (report to your doctor or health care professional if they continue or are bothersome): -diarrhea -loss of appetite -nausea, vomiting This list may not describe all possible side effects. Call your doctor for medical advice about side effects. You may report side effects to FDA at 1-800-FDA-1088. Where should I keep my medicine? Keep out of the reach of children. Store at room temperature, below 30 degrees C (86 degrees F). Protect from light. Keep container tightly closed. Throw away any unused medicine after the expiration date. Taking this medicine after the expiration date can make you seriously ill. NOTE: This sheet is a summary. It may not cover all possible information. If you have questions about this medicine, talk to your doctor, pharmacist, or health care provider.    2016, Elsevier/Gold Standard. (2015-02-11 12:10:28)  Acetaminophen; Oxycodone tablets What is this  medicine? ACETAMINOPHEN; OXYCODONE (a set a MEE noe fen; ox i KOE done) is a pain reliever. It is used to treat moderate to severe pain. This medicine may be used for other purposes; ask your health care provider or pharmacist if you have questions. What should I tell my health care provider before I take this medicine? They need to know if you have any of these conditions: -brain tumor -Crohn's disease, inflammatory bowel disease, or ulcerative colitis -drug abuse or addiction -head injury -heart or  circulation problems -if you often drink alcohol -kidney disease or problems going to the bathroom -liver disease -lung disease, asthma, or breathing problems -an unusual or allergic reaction to acetaminophen, oxycodone, other opioid analgesics, other medicines, foods, dyes, or preservatives -pregnant or trying to get pregnant -breast-feeding How should I use this medicine? Take this medicine by mouth with a full glass of water. Follow the directions on the prescription label. You can take it with or without food. If it upsets your stomach, take it with food. Take your medicine at regular intervals. Do not take it more often than directed. Talk to your pediatrician regarding the use of this medicine in children. Special care may be needed. Patients over 71 years old may have a stronger reaction and need a smaller dose. Overdosage: If you think you have taken too much of this medicine contact a poison control center or emergency room at once. NOTE: This medicine is only for you. Do not share this medicine with others. What if I miss a dose? If you miss a dose, take it as soon as you can. If it is almost time for your next dose, take only that dose. Do not take double or extra doses. What may interact with this medicine? -alcohol -antihistamines -barbiturates like amobarbital, butalbital, butabarbital, methohexital, pentobarbital, phenobarbital, thiopental, and secobarbital -benztropine -drugs for bladder problems like solifenacin, trospium, oxybutynin, tolterodine, hyoscyamine, and methscopolamine -drugs for breathing problems like ipratropium and tiotropium -drugs for certain stomach or intestine problems like propantheline, homatropine methylbromide, glycopyrrolate, atropine, belladonna, and dicyclomine -general anesthetics like etomidate, ketamine, nitrous oxide, propofol, desflurane, enflurane, halothane, isoflurane, and sevoflurane -medicines for depression, anxiety, or psychotic  disturbances -medicines for sleep -muscle relaxants -naltrexone -narcotic medicines (opiates) for pain -phenothiazines like perphenazine, thioridazine, chlorpromazine, mesoridazine, fluphenazine, prochlorperazine, promazine, and trifluoperazine -scopolamine -tramadol -trihexyphenidyl This list may not describe all possible interactions. Give your health care provider a list of all the medicines, herbs, non-prescription drugs, or dietary supplements you use. Also tell them if you smoke, drink alcohol, or use illegal drugs. Some items may interact with your medicine. What should I watch for while using this medicine? Tell your doctor or health care professional if your pain does not go away, if it gets worse, or if you have new or a different type of pain. You may develop tolerance to the medicine. Tolerance means that you will need a higher dose of the medication for pain relief. Tolerance is normal and is expected if you take this medicine for a long time. Do not suddenly stop taking your medicine because you may develop a severe reaction. Your body becomes used to the medicine. This does NOT mean you are addicted. Addiction is a behavior related to getting and using a drug for a non-medical reason. If you have pain, you have a medical reason to take pain medicine. Your doctor will tell you how much medicine to take. If your doctor wants you to stop the medicine, the dose will be slowly  lowered over time to avoid any side effects. You may get drowsy or dizzy. Do not drive, use machinery, or do anything that needs mental alertness until you know how this medicine affects you. Do not stand or sit up quickly, especially if you are an older patient. This reduces the risk of dizzy or fainting spells. Alcohol may interfere with the effect of this medicine. Avoid alcoholic drinks. There are different types of narcotic medicines (opiates) for pain. If you take more than one type at the same time, you may have  more side effects. Give your health care provider a list of all medicines you use. Your doctor will tell you how much medicine to take. Do not take more medicine than directed. Call emergency for help if you have problems breathing. The medicine will cause constipation. Try to have a bowel movement at least every 2 to 3 days. If you do not have a bowel movement for 3 days, call your doctor or health care professional. Do not take Tylenol (acetaminophen) or medicines that have acetaminophen with this medicine. Too much acetaminophen can be very dangerous. Many nonprescription medicines contain acetaminophen. Always read the labels carefully to avoid taking more acetaminophen. What side effects may I notice from receiving this medicine? Side effects that you should report to your doctor or health care professional as soon as possible: -allergic reactions like skin rash, itching or hives, swelling of the face, lips, or tongue -breathing difficulties, wheezing -confusion -light headedness or fainting spells -severe stomach pain -unusually weak or tired -yellowing of the skin or the whites of the eyes Side effects that usually do not require medical attention (report to your doctor or health care professional if they continue or are bothersome): -dizziness -drowsiness -nausea -vomiting This list may not describe all possible side effects. Call your doctor for medical advice about side effects. You may report side effects to FDA at 1-800-FDA-1088. Where should I keep my medicine? Keep out of the reach of children. This medicine can be abused. Keep your medicine in a safe place to protect it from theft. Do not share this medicine with anyone. Selling or giving away this medicine is dangerous and against the law. This medicine may cause accidental overdose and death if it taken by other adults, children, or pets. Mix any unused medicine with a substance like cat litter or coffee grounds. Then throw the  medicine away in a sealed container like a sealed bag or a coffee can with a lid. Do not use the medicine after the expiration date. Store at room temperature between 20 and 25 degrees C (68 and 77 degrees F). NOTE: This sheet is a summary. It may not cover all possible information. If you have questions about this medicine, talk to your doctor, pharmacist, or health care provider.    2016, Elsevier/Gold Standard. (2014-09-22 15:18:46)

## 2015-10-17 NOTE — ED Provider Notes (Signed)
CSN: [id number]     Arrival date & time 10/17/15  0201 History   By signing my name below, I, Arlan Organshley Leger, attest that this documentation has been prepared under the direction and in the presence of Dione Boozeavid Jodi Criscuolo, MD.  Electronically Signed: Arlan OrganAshley Leger, ED Scribe. 10/17/2015. 2:45 AM.   Chief Complaint  Patient presents with  . Ankle Pain   The history is provided by the patient. No language interpreter was used.    HPI Comments: Penny Tucker is a 34 y.o. female without any pertinent past medical history who presents to the Emergency Department complaining of constant, ongoing R ankle pain x 2 weeks. Currently she rates pain 7/10. Discomfort is exacerbated with deep palpation and certain movements. No alleviating factors at this time. Pt also reports ongoing, associated redness to the ankle x 4 days. Prescribed Oxycodone 5 mg attempted previously with some improvement. OTC Ibuprofen also attempted without any long term improvement for pain. Penny Tucker recently underwent an incision and drainage procedure on 11/27 and was started on a 10 day course of Bactrim. Pt has now completed her antibiotic, however, she states pain has returned with new onset redness. Pt states she is due to follow up with her surgeon-Dr. Leron CroakJohn Poggi in 2 days.  PCP: No PCP Per Patient    Past Medical History  Diagnosis Date  . Drug use    Past Surgical History  Procedure Laterality Date  . Cholecystectomy    . Cesarean section    . Incision and drainage abscess Right 10/02/2015    Procedure: INCISION AND DRAINAGE ABSCESS;  Surgeon: Christena FlakeJohn J Poggi, MD;  Location: [facility] ORS;  Service: Orthopedics;  Laterality: Right;   No family history on file. Social History  Substance Use Topics  . Smoking status: Current Every Day Smoker -- 1.00 packs/day    Types: Cigarettes  . Smokeless tobacco: None  . Alcohol Use: No   OB History    Gravida Para Term Preterm AB TAB SAB Ectopic Multiple Living   0 0 0 0 0 0 0 0 0 0       Review of Systems  Constitutional: Negative for fever and chills.  Respiratory: Negative for shortness of breath.   Cardiovascular: Negative for chest pain.  Gastrointestinal: Negative for nausea, vomiting and abdominal pain.  Musculoskeletal: Positive for arthralgias.  Skin: Positive for wound. Negative for rash.  Neurological: Negative for headaches.  Psychiatric/Behavioral: Negative for confusion.  All other systems reviewed and are negative.     Allergies  Review of patient's allergies indicates no known allergies.  Home Medications   Prior to Admission medications   Medication Sig Start Date End Date Taking? Authorizing Provider  oxyCODONE (ROXICODONE) 5 MG immediate release tablet Take 1 tablet (5 mg total) by mouth every 4 (four) hours as needed for severe pain. 10/04/15   Enedina FinnerSona Patel, MD  sulfamethoxazole-trimethoprim (BACTRIM DS,SEPTRA DS) 800-160 MG tablet Take 1 tablet by mouth every 12 (twelve) hours. 10/04/15   Enedina FinnerSona Patel, MD   Triage Vitals: BP 126/84 mmHg  Pulse 91  Temp(Src) 97.8 F (36.6 C) (Oral)  Resp 18  SpO2 100%  LMP 09/30/2015   Physical Exam  Constitutional: She is oriented to person, place, and time. She appears well-developed and well-nourished. No distress.  HENT:  Head: Normocephalic and atraumatic.  Eyes: EOM are normal. Pupils are equal, round, and reactive to light.  Neck: Normal range of motion. Neck supple. No JVD present.  Cardiovascular: Normal rate, regular rhythm and  normal heart sounds.   No murmur heard. Pulmonary/Chest: Effort normal and breath sounds normal. She has no wheezes. She has no rales. She exhibits no tenderness.  Abdominal: Soft. Bowel sounds are normal. She exhibits no distension and no mass. There is no tenderness.  Musculoskeletal: Normal range of motion. She exhibits no edema.  Surgical scar on lateral aspect of R ankle healing well with sutures in place Moderate erythema and warm to the lateral malleolus No  lymphatic streaks No fluctuance   Lymphadenopathy:    She has no cervical adenopathy.  Neurological: She is alert and oriented to person, place, and time. No cranial nerve deficit. Coordination normal.  Skin: Skin is warm and dry. No rash noted.  Psychiatric: She has a normal mood and affect. Her behavior is normal. Judgment and thought content normal.  Nursing note and vitals reviewed.   ED Course  Procedures (including critical care time)  DIAGNOSTIC STUDIES: Oxygen Saturation is 100% on RA, Normal by my interpretation.    COORDINATION OF CARE: 2:31 AM- Will give IV Vancocin. Discussed treatment plan with pt at bedside and pt agreed to plan.      MDM   Final diagnoses:  Pain in right ankle  Cellulitis of right ankle    Right ankle pain with findings consistent with cellulitis. Old records were reviewed in she did have an abscess incised and drained. Patient states that she has run out of her oxycodone tablets stating she was prescribed 30 on discharge. I have reviewed her record on the [place] controlled substance reporting website indicating that his only recent narcotic prescription she has received. On exam today, there is no evidence of fluid collection at the site. She is given a dose of vancomycin. She started getting worse at the time she discontinued trimethoprim-sulfamethoxazole. It was noted on her hospital record that she is positive for MRSA. I will try her on doxycycline as an alternate antibiotic for MRSA. She is a prescription for small number of oxycodone have acetaminophen until she can see her orthopedic surgeon. Orthopedic follow-up is scheduled for 2 days from now.  I personally performed the services described in this documentation, which was scribed in my presence. The recorded information has been reviewed and is accurate.      Dione Booze, MD 10/17/15 [phone]

## 2015-10-17 NOTE — ED Notes (Addendum)
Dr. Glick at bedside.  

## 2015-11-04 ENCOUNTER — Emergency Department
Admission: EM | Admit: 2015-11-04 | Discharge: 2015-11-04 | Payer: Self-pay | Attending: Emergency Medicine | Admitting: Emergency Medicine

## 2015-11-04 ENCOUNTER — Emergency Department: Payer: Self-pay

## 2015-11-04 ENCOUNTER — Emergency Department: Payer: Medicaid Other

## 2015-11-04 DIAGNOSIS — G8929 Other chronic pain: Secondary | ICD-10-CM | POA: Insufficient documentation

## 2015-11-04 DIAGNOSIS — Y9389 Activity, other specified: Secondary | ICD-10-CM | POA: Insufficient documentation

## 2015-11-04 DIAGNOSIS — S80211A Abrasion, right knee, initial encounter: Secondary | ICD-10-CM | POA: Insufficient documentation

## 2015-11-04 DIAGNOSIS — Y998 Other external cause status: Secondary | ICD-10-CM | POA: Insufficient documentation

## 2015-11-04 DIAGNOSIS — R52 Pain, unspecified: Secondary | ICD-10-CM

## 2015-11-04 DIAGNOSIS — Z792 Long term (current) use of antibiotics: Secondary | ICD-10-CM | POA: Insufficient documentation

## 2015-11-04 DIAGNOSIS — S32018A Other fracture of first lumbar vertebra, initial encounter for closed fracture: Secondary | ICD-10-CM | POA: Insufficient documentation

## 2015-11-04 DIAGNOSIS — Y9289 Other specified places as the place of occurrence of the external cause: Secondary | ICD-10-CM | POA: Insufficient documentation

## 2015-11-04 DIAGNOSIS — F1110 Opioid abuse, uncomplicated: Secondary | ICD-10-CM | POA: Insufficient documentation

## 2015-11-04 DIAGNOSIS — F17210 Nicotine dependence, cigarettes, uncomplicated: Secondary | ICD-10-CM | POA: Insufficient documentation

## 2015-11-04 DIAGNOSIS — S32000A Wedge compression fracture of unspecified lumbar vertebra, initial encounter for closed fracture: Secondary | ICD-10-CM

## 2015-11-04 DIAGNOSIS — W500XXA Accidental hit or strike by another person, initial encounter: Secondary | ICD-10-CM | POA: Insufficient documentation

## 2015-11-04 DIAGNOSIS — Z3202 Encounter for pregnancy test, result negative: Secondary | ICD-10-CM | POA: Insufficient documentation

## 2015-11-04 LAB — URINALYSIS COMPLETE WITH MICROSCOPIC (ARMC ONLY)
BACTERIA UA: NONE SEEN
Bilirubin Urine: NEGATIVE
GLUCOSE, UA: NEGATIVE mg/dL
Hgb urine dipstick: NEGATIVE
KETONES UR: NEGATIVE mg/dL
Leukocytes, UA: NEGATIVE
NITRITE: NEGATIVE
PROTEIN: NEGATIVE mg/dL
SPECIFIC GRAVITY, URINE: 1.01 (ref 1.005–1.030)
pH: 6 (ref 5.0–8.0)

## 2015-11-04 LAB — POCT PREGNANCY, URINE: PREG TEST UR: NEGATIVE

## 2015-11-04 MED ORDER — OXYCODONE-ACETAMINOPHEN 5-325 MG PO TABS
1.0000 | ORAL_TABLET | Freq: Once | ORAL | Status: AC
Start: 2015-11-04 — End: 2015-11-04
  Administered 2015-11-04: 1 via ORAL
  Filled 2015-11-04: qty 1

## 2015-11-04 MED ORDER — KETOROLAC TROMETHAMINE 60 MG/2ML IM SOLN
60.0000 mg | Freq: Once | INTRAMUSCULAR | Status: AC
  Administered 2015-11-04: 60 mg via INTRAMUSCULAR
  Filled 2015-11-04: qty 2

## 2015-11-04 MED ORDER — IBUPROFEN 600 MG PO TABS: 600.0000 mg | ORAL_TABLET | Freq: Four times a day (QID) | ORAL | Status: DC | PRN

## 2015-11-04 MED ORDER — DIAZEPAM 2 MG PO TABS
ORAL_TABLET | ORAL | Status: AC
  Filled 2015-11-04: qty 1

## 2015-11-04 MED ORDER — DIAZEPAM 2 MG PO TABS
2.0000 mg | ORAL_TABLET | Freq: Once | ORAL | Status: AC
  Administered 2015-11-04: 2 mg via ORAL

## 2015-11-04 MED ORDER — HYDROCODONE-ACETAMINOPHEN 5-325 MG PO TABS: 1.0000 | ORAL_TABLET | ORAL | Status: DC | PRN
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
## 2015-11-04 NOTE — Discharge Instructions (Signed)
Follow-up with Dr. Ernest PineHooten. You'll need to call the office to make an appointment. Ibuprofen 600 milligrams 3 times a day with food for pain and inflammation. Norco is a pain medication that you may take with your ibuprofen. As we discussed earlier using heroin and taking  pain medication is not advisable. The health officials at the jail the aware that you do use heroin

## 2015-11-04 NOTE — ED Notes (Signed)
POCT PREG NEGATIVE. °
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
## 2016-04-01 IMAGING — US US MISC SOFT TISSUE
1 series · 11 of 11 positions shown · non-contrast
Comparison: Right ankle radiographs 09/30/2015

CLINICAL DATA: Swelling over the right lateral ankle. Patient cut
the lateral ankle while shaving and the area got infected.

EXAM:
SOFT TISSUE ULTRASOUND - MISCELLANEOUS
TECHNIQUE: Ultrasound examination focused to the soft tissues of the right
lateral ankle area obtained.

[Series 1: us misc soft tissue · 0.05mm/px · 11 of 11 slices shown]
[im 1/11]
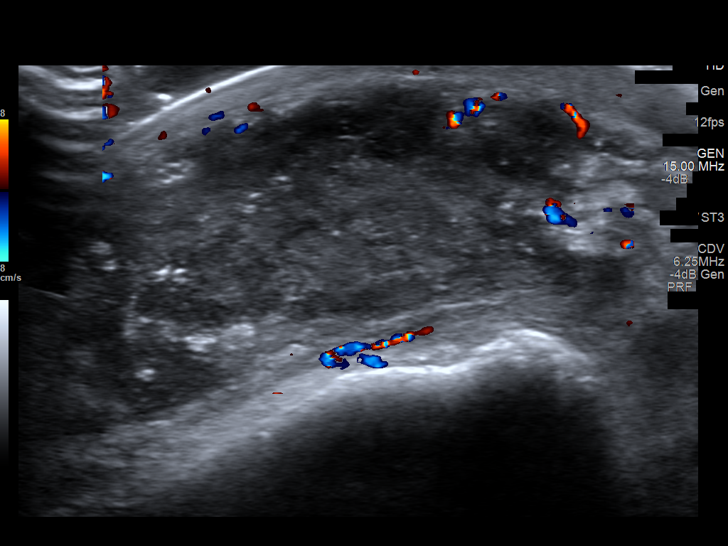
[im 2/11]
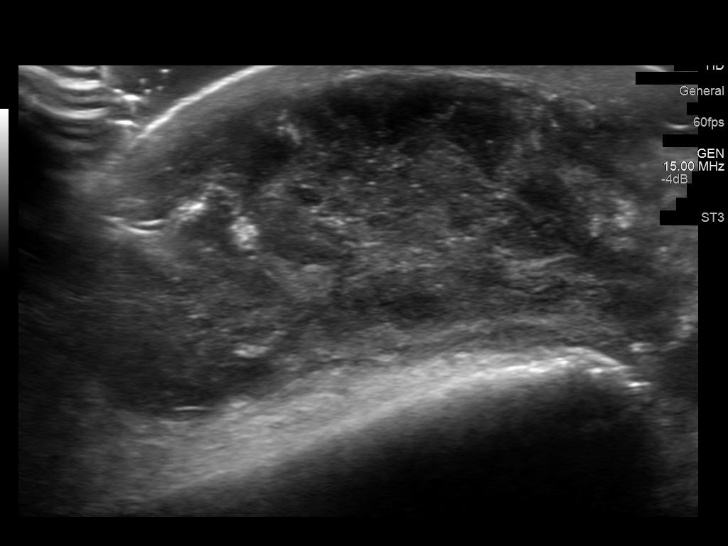
[im 3/11]
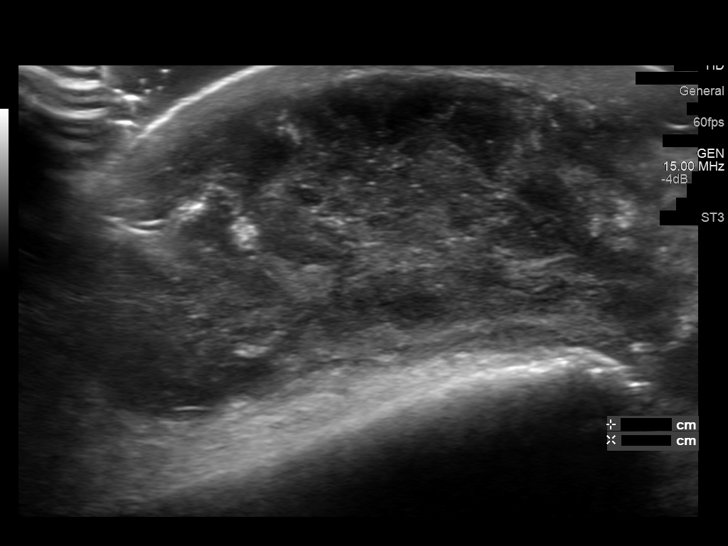
[im 4/11]
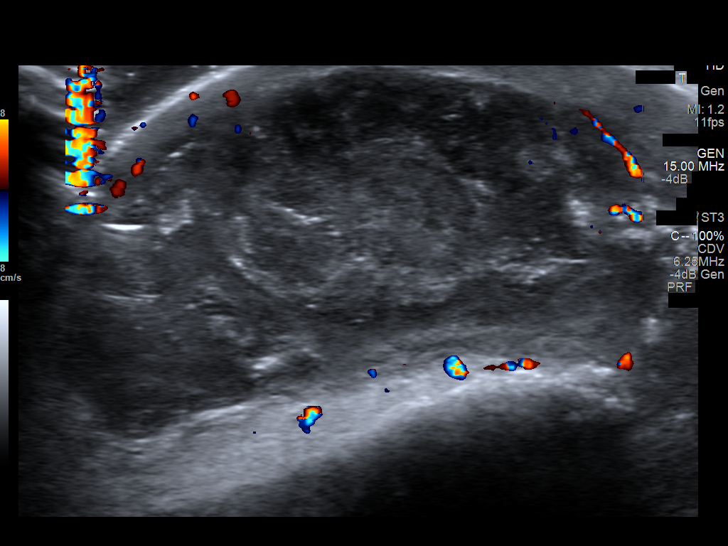
[im 5/11]
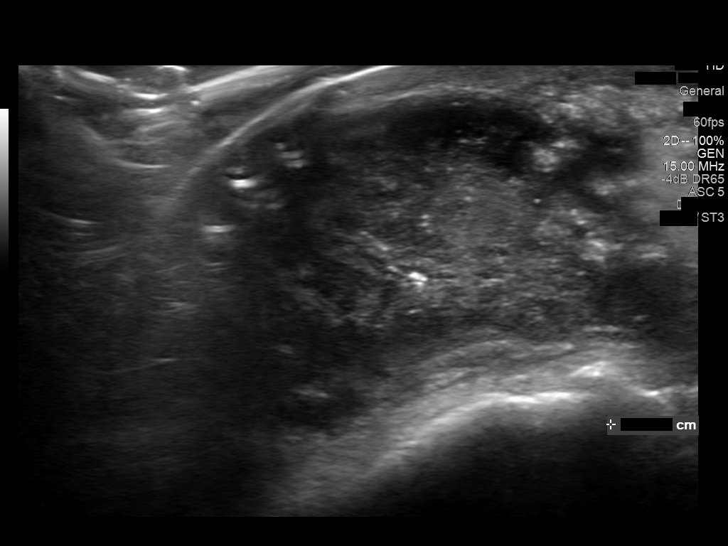
[im 6/11]
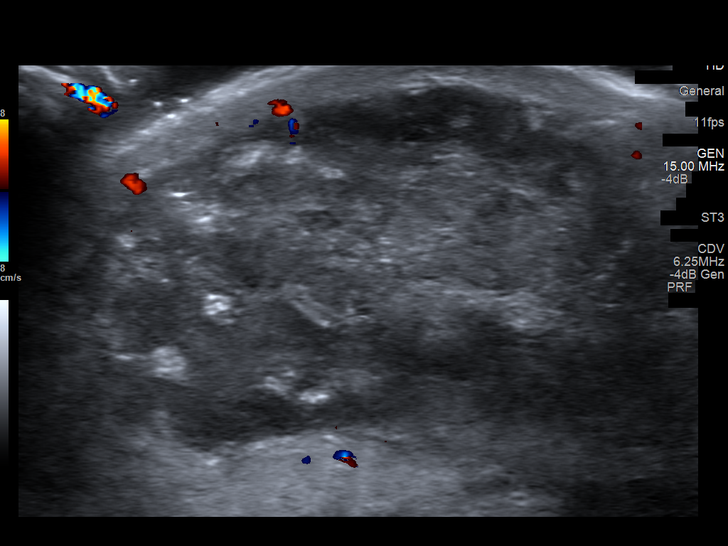
[im 7/11]
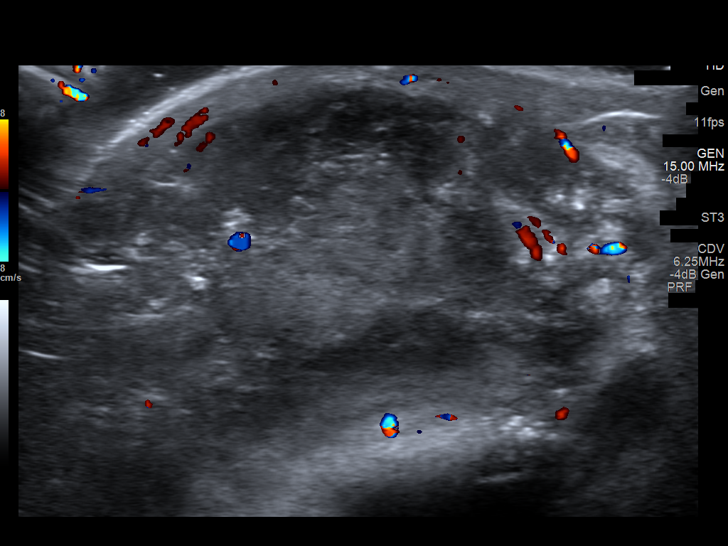
[im 8/11]
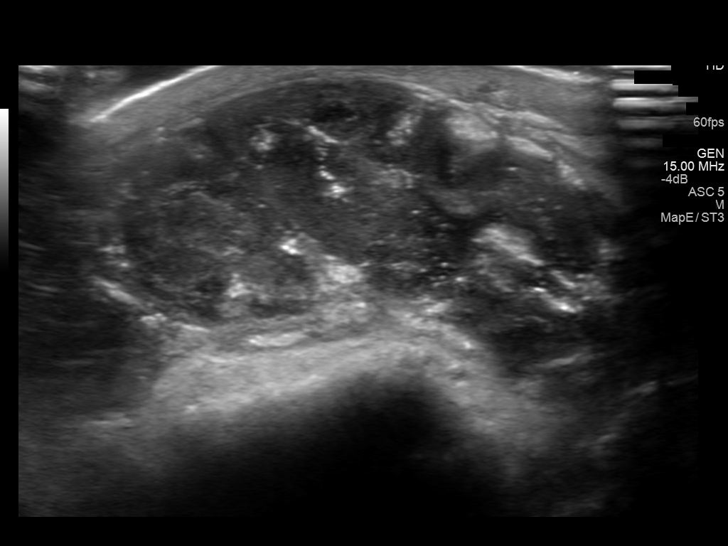
[im 9/11]
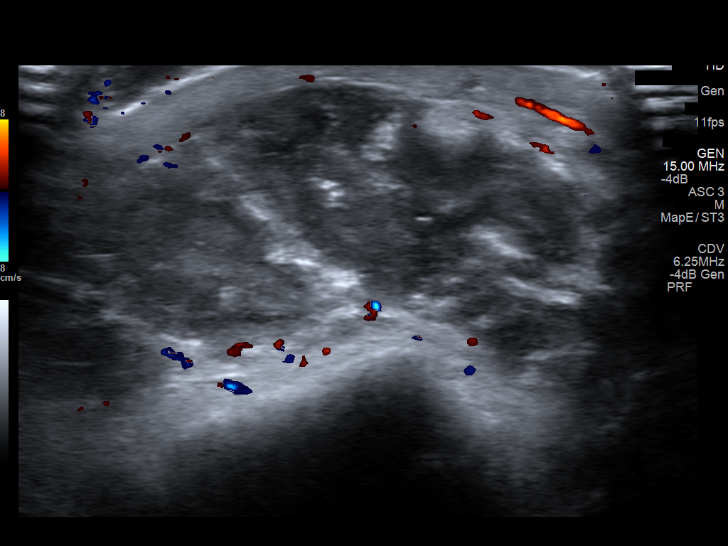
[im 10/11]
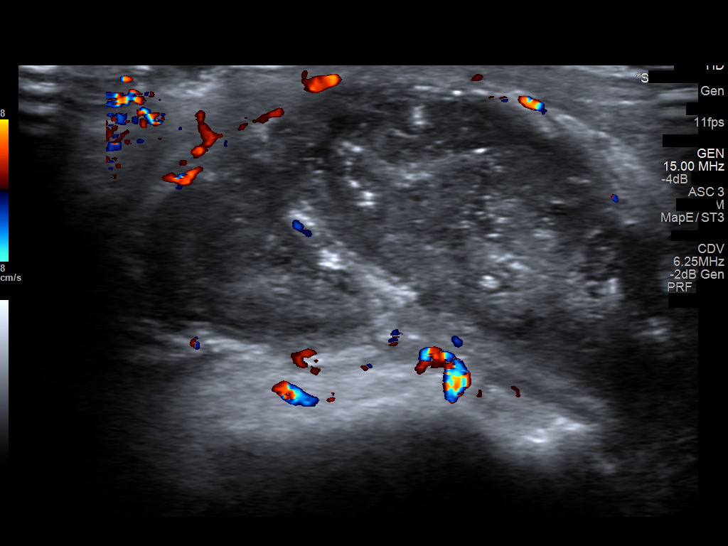
[im 11/11]
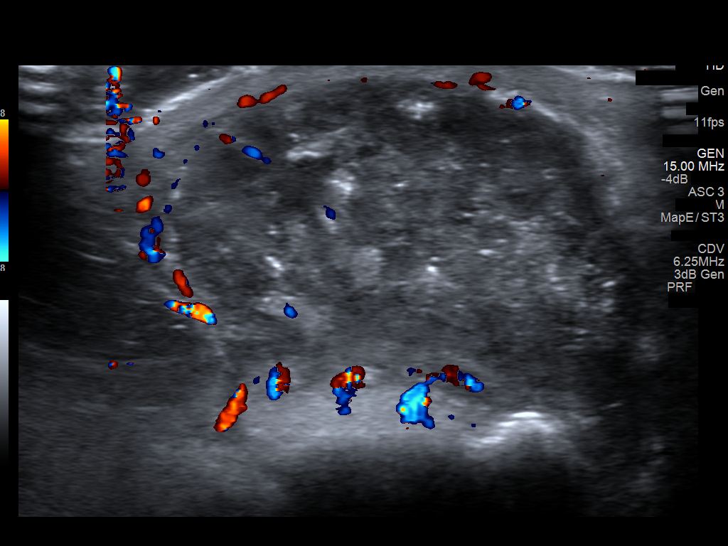

[11 of 11 positions shown; findings below may reference images not displayed]

FINDINGS: Corresponding to the area of clinical concern in the lateral right
ankle, there is a complex heterogeneous hypo and hyperechoic
circumscribed structure with peripheral flow and internal flow
likely within the septation. The area measures about 4.3 x 2.1 x
cm. Appearance may represent abscess or infected hematoma.
IMPRESSION: Complex collection with peripheral increased vascular flow
demonstrated in the lateral right ankle.

## 2016-05-23 ENCOUNTER — Encounter: Payer: Self-pay | Admitting: Emergency Medicine

## 2016-05-23 ENCOUNTER — Emergency Department: Payer: Medicaid Other

## 2016-05-23 ENCOUNTER — Emergency Department
Admission: EM | Admit: 2016-05-23 | Discharge: 2016-05-23 | Disposition: A | Discharge status: Home / Self Care | Payer: Medicaid Other | Attending: Emergency Medicine | Admitting: Emergency Medicine

## 2016-05-23 DIAGNOSIS — Y929 Unspecified place or not applicable: Secondary | ICD-10-CM | POA: Diagnosis not present

## 2016-05-23 DIAGNOSIS — W1809XA Striking against other object with subsequent fall, initial encounter: Secondary | ICD-10-CM | POA: Diagnosis not present

## 2016-05-23 DIAGNOSIS — Y999 Unspecified external cause status: Secondary | ICD-10-CM | POA: Insufficient documentation

## 2016-05-23 DIAGNOSIS — Y939 Activity, unspecified: Secondary | ICD-10-CM | POA: Insufficient documentation

## 2016-05-23 DIAGNOSIS — F1110 Opioid abuse, uncomplicated: Secondary | ICD-10-CM | POA: Insufficient documentation

## 2016-05-23 DIAGNOSIS — S0990XA Unspecified injury of head, initial encounter: Secondary | ICD-10-CM | POA: Diagnosis present

## 2016-05-23 DIAGNOSIS — S0093XA Contusion of unspecified part of head, initial encounter: Secondary | ICD-10-CM | POA: Insufficient documentation

## 2016-05-23 DIAGNOSIS — F17210 Nicotine dependence, cigarettes, uncomplicated: Secondary | ICD-10-CM | POA: Insufficient documentation

## 2016-05-23 MED ORDER — IBUPROFEN 800 MG PO TABS: 800.0000 mg | ORAL_TABLET | Freq: Three times a day (TID) | ORAL | Status: DC | PRN

## 2016-05-23 MED ORDER — PROMETHAZINE HCL 25 MG PO TABS: 25.0000 mg | ORAL_TABLET | Freq: Four times a day (QID) | ORAL | Status: DC | PRN

## 2016-05-23 NOTE — Discharge Instructions (Signed)
Cryotherapy °Cryotherapy means treatment with cold. Ice or gel packs can be used to reduce both pain and swelling. Ice is the most helpful within the first 24 to 48 hours after an injury or flare-up from overusing a muscle or joint. Sprains, strains, spasms, burning pain, shooting pain, and aches can all be eased with ice. Ice can also be used when recovering from surgery. Ice is effective, has very few side effects, and is safe for most people to use. °PRECAUTIONS  °Ice is not a safe treatment option for people with: °· Raynaud phenomenon. This is a condition affecting small blood vessels in the extremities. Exposure to cold may cause your problems to return. °· Cold hypersensitivity. There are many forms of cold hypersensitivity, including: °· Cold urticaria. Red, itchy hives appear on the skin when the tissues begin to warm after being iced. °· Cold erythema. This is a red, itchy rash caused by exposure to cold. °· Cold hemoglobinuria. Red blood cells break down when the tissues begin to warm after being iced. The hemoglobin that carry oxygen are passed into the urine because they cannot combine with blood proteins fast enough. °· Numbness or altered sensitivity in the area being iced. °If you have any of the following conditions, do not use ice until you have discussed cryotherapy with your caregiver: °· Heart conditions, such as arrhythmia, angina, or chronic heart disease. °· High blood pressure. °· Healing wounds or open skin in the area being iced. °· Current infections. °· Rheumatoid arthritis. °· Poor circulation. °· Diabetes. °Ice slows the blood flow in the region it is applied. This is beneficial when trying to stop inflamed tissues from spreading irritating chemicals to surrounding tissues. However, if you expose your skin to cold temperatures for too long or without the proper protection, you can damage your skin or nerves. Watch for signs of skin damage due to cold. °HOME CARE INSTRUCTIONS °Follow  these tips to use ice and cold packs safely. °· Place a dry or damp towel between the ice and skin. A damp towel will cool the skin more quickly, so you may need to shorten the time that the ice is used. °· For a more rapid response, add gentle compression to the ice. °· Ice for no more than 10 to 20 minutes at a time. The bonier the area you are icing, the less time it will take to get the benefits of ice. °· Check your skin after 5 minutes to make sure there are no signs of a poor response to cold or skin damage. °· Rest 20 minutes or more between uses. °· Once your skin is numb, you can end your treatment. You can test numbness by very lightly touching your skin. The touch should be so light that you do not see the skin dimple from the pressure of your fingertip. When using ice, most people will feel these normal sensations in this order: cold, burning, aching, and numbness. °· Do not use ice on someone who cannot communicate their responses to pain, such as small children or people with dementia. °HOW TO MAKE AN ICE PACK °Ice packs are the most common way to use ice therapy. Other methods include ice massage, ice baths, and cryosprays. Muscle creams that cause a cold, tingly feeling do not offer the same benefits that ice offers and should not be used as a substitute unless recommended by your caregiver. °To make an ice pack, do one of the following: °· Place crushed ice or a   bag of frozen vegetables in a sealable plastic bag. Squeeze out the excess air. Place this bag inside another plastic bag. Slide the bag into a pillowcase or place a damp towel between your skin and the bag.  Mix 3 parts water with 1 part rubbing alcohol. Freeze the mixture in a sealable plastic bag. When you remove the mixture from the freezer, it will be slushy. Squeeze out the excess air. Place this bag inside another plastic bag. Slide the bag into a pillowcase or place a damp towel between your skin and the bag. SEEK MEDICAL CARE  IF:  You develop white spots on your skin. This may give the skin a blotchy (mottled) appearance.  Your skin turns blue or pale.  Your skin becomes waxy or hard.  Your swelling gets worse. MAKE SURE YOU:   Understand these instructions.  Will watch your condition.  Will get help right away if you are not doing well or get worse.   This information is not intended to replace advice given to you by your health care provider. Make sure you discuss any questions you have with your health care provider.   Document Released: 06/18/2011 Document Revised: 11/12/2014 Document Reviewed: 06/18/2011 Elsevier Interactive Patient Education 2016 [employer]  Hematoma A hematoma is a collection of blood under the skin, in an organ, in a body space, in a joint space, or in other tissue. The blood can clot to form a lump that you can see and feel. The lump is often firm and may sometimes become sore and tender. Most hematomas get better in a few days to weeks. However, some hematomas may be serious and require medical care. Hematomas can range in size from very small to very large. CAUSES  A hematoma can be caused by a blunt or penetrating injury. It can also be caused by spontaneous leakage from a blood vessel under the skin. Spontaneous leakage from a blood vessel is more likely to occur in older people, especially those taking blood thinners. Sometimes, a hematoma can develop after certain medical procedures. SIGNS AND SYMPTOMS   A firm lump on the body.  Possible pain and tenderness in the area.  Bruising.Blue, dark blue, purple-red, or yellowish skin may appear at the site of the hematoma if the hematoma is close to the surface of the skin. For hematomas in deeper tissues or body spaces, the signs and symptoms may be subtle. For example, an intra-abdominal hematoma may cause abdominal pain, weakness, fainting, and shortness of breath. An intracranial hematoma may cause a headache or symptoms  such as weakness, trouble speaking, or a change in consciousness. DIAGNOSIS  A hematoma can usually be diagnosed based on your medical history and a physical exam. Imaging tests may be needed if your health care provider suspects a hematoma in deeper tissues or body spaces, such as the abdomen, head, or chest. These tests may include ultrasonography or a CT scan.  TREATMENT  Hematomas usually go away on their own over time. Rarely does the blood need to be drained out of the body. Large hematomas or those that may affect vital organs will sometimes need surgical drainage or monitoring. HOME CARE INSTRUCTIONS   Apply ice to the injured area:   Put ice in a plastic bag.   Place a towel between your skin and the bag.   Leave the ice on for 20 minutes, 2-3 times a day for the first 1 to 2 days.   After the first 2 days,  switch to using warm compresses on the hematoma.   Elevate the injured area to help decrease pain and swelling. Wrapping the area with an elastic bandage may also be helpful. Compression helps to reduce swelling and promotes shrinking of the hematoma. Make sure the bandage is not wrapped too tight.   If your hematoma is on a lower extremity and is painful, crutches may be helpful for a couple days.   Only take over-the-counter or prescription medicines as directed by your health care provider. SEEK IMMEDIATE MEDICAL CARE IF:   You have increasing pain, or your pain is not controlled with medicine.   You have a fever.   You have worsening swelling or discoloration.   Your skin over the hematoma breaks or starts bleeding.   Your hematoma is in your chest or abdomen and you have weakness, shortness of breath, or a change in consciousness.  Your hematoma is on your scalp (caused by a fall or injury) and you have a worsening headache or a change in alertness or consciousness. MAKE SURE YOU:   Understand these instructions.  Will watch your condition.  Will  get help right away if you are not doing well or get worse.   This information is not intended to replace advice given to you by your health care provider. Make sure you discuss any questions you have with your health care provider.   Document Released: 06/05/2004 Document Revised: 06/24/2013 Document Reviewed: 04/01/2013 Elsevier Interactive Patient Education 2016 Elsevier Inc.  Head Injury, Adult You have a head injury. Headaches and throwing up (vomiting) are common after a head injury. It should be easy to wake up from sleeping. Sometimes you must stay in the hospital. Most problems happen within the first 24 hours. Side effects may occur up to 7-10 days after the injury.  WHAT ARE THE TYPES OF HEAD INJURIES? Head injuries can be as minor as a bump. Some head injuries can be more severe. More severe head injuries include:  A jarring injury to the brain (concussion).  A bruise of the brain (contusion). This mean there is bleeding in the brain that can cause swelling.  A cracked skull (skull fracture).  Bleeding in the brain that collects, clots, and forms a bump (hematoma). WHEN SHOULD I GET HELP RIGHT AWAY?   You are confused or sleepy.  You cannot be woken up.  You feel sick to your stomach (nauseous) or keep throwing up (vomiting).  Your dizziness or unsteadiness is getting worse.  You have very bad, lasting headaches that are not helped by medicine. Take medicines only as told by your doctor.  You cannot use your arms or legs like normal.  You cannot walk.  You notice changes in the black spots in the center of the colored part of your eye (pupil).  You have clear or bloody fluid coming from your nose or ears.  You have trouble seeing. During the next 24 hours after the injury, you must stay with someone who can watch you. This person should get help right away (call 911 in the [place] if you start to shake and are not able to control it (have seizures), you pass out, or  you are unable to wake up. HOW CAN I PREVENT A HEAD INJURY IN THE FUTURE?  Wear seat belts.  Wear a helmet while bike riding and playing sports like football.  Stay away from dangerous activities around the house. WHEN CAN I RETURN TO NORMAL ACTIVITIES AND ATHLETICS? See your  doctor before doing these activities. You should not do normal activities or play contact sports until 1 week after the following symptoms have stopped:  Headache that does not go away.  Dizziness.  Poor attention.  Confusion.  Memory problems.  Sickness to your stomach or throwing up.  Tiredness.  Fussiness.  Bothered by bright lights or loud noises.  Anxiousness or depression.  Restless sleep. MAKE SURE YOU:   Understand these instructions.  Will watch your condition.  Will get help right away if you are not doing well or get worse.   This information is not intended to replace advice given to you by your health care provider. Make sure you discuss any questions you have with your health care provider.   Document Released: 10/04/2008 Document Revised: 11/12/2014 Document Reviewed: 06/29/2013 Elsevier Interactive Patient Education [employer].

## 2016-05-23 NOTE — ED Notes (Signed)
Pt reports falling off couch and hitting right side of forehead on coffee table. Pt denies LOC. Pt with small hematoma to right side of forehead, pt reports vomiting x5 since the accident. Pt ambulatory to triage, pt alert and oriented, clear speech upon arrival.

## 2016-05-23 NOTE — ED Notes (Signed)
Pt alert and oriented X4, active, cooperative, pt in NAD. RR even and unlabored, color WNL.  Pt informed to return if any life threatening symptoms occur.   

## 2016-05-23 NOTE — ED Provider Notes (Signed)
[facility] Emergency Department Provider Note  ____________________________________________  Time seen: Approximately 2:26 PM  I have reviewed the triage vital signs and the nursing notes.   HISTORY  Chief Complaint Head Injury    HPI Penny Tucker is a 35 y.o. female who presents for evaluation of fall off couch and hitting the right side of her forehead on file cabinet. Patient denies any loss of consciousness complains of vomiting 5 after hitting her head. Describes pain as 8/10 and has not taken any medications and is afraid to sleep.   Past Medical History  Diagnosis Date  . Drug use     Patient Active Problem List   Diagnosis Date Noted  . Cellulitis of right ankle 09/30/2015  . Ankle abscess 09/30/2015    Past Surgical History  Procedure Laterality Date  . Cholecystectomy    . Cesarean section    . Incision and drainage abscess Right 10/02/2015    Procedure: INCISION AND DRAINAGE ABSCESS;  Surgeon: Christena FlakeJohn J Poggi, MD;  Location: [facility] ORS;  Service: Orthopedics;  Laterality: Right;    Current Outpatient Rx  Name  Route  Sig  Dispense  Refill  . ibuprofen (ADVIL,MOTRIN) 800 MG tablet   Oral   Take 1 tablet (800 mg total) by mouth every 8 (eight) hours as needed.   30 tablet   0   . promethazine (PHENERGAN) 25 MG tablet   Oral   Take 1 tablet (25 mg total) by mouth every 6 (six) hours as needed for nausea or vomiting.   8 tablet   0     Allergies Review of patient's allergies indicates no known allergies.  No family history on file.  Social History Social History  Substance Use Topics  . Smoking status: Current Every Day Smoker -- 1.00 packs/day    Types: Cigarettes  . Smokeless tobacco: None  . Alcohol Use: No    Review of Systems Constitutional: No fever/chills Eyes: No visual changes. ENT: No sore throat. Cardiovascular: Denies chest pain. Respiratory: Denies shortness of breath. Gastrointestinal: No abdominal  pain.  Positive nausea and vomiting.  No diarrhea.  No constipation. Genitourinary: Negative for dysuria. Musculoskeletal: Positive for hematoma to the right temporal area Skin: Negative for rash. Neurological: Negative for headaches, focal weakness or numbness.  10-point ROS otherwise negative.  ____________________________________________   PHYSICAL EXAM:  VITAL SIGNS: ED Triage Vitals  Enc Vitals Group     BP 05/23/16 1318 147/78 mmHg     Pulse Rate 05/23/16 1318 104     Resp 05/23/16 1318 16     Temp 05/23/16 1318 98.3 F (36.8 C)     Temp Source 05/23/16 1318 Oral     SpO2 05/23/16 1318 97 %     Weight --      Height --      Head Cir --      Peak Flow --      Pain Score 05/23/16 1319 8     Pain Loc --      Pain Edu? --      Excl. in GC? --     Constitutional: Alert and oriented. Well appearing and in no acute distress. Eyes: Conjunctivae are normal. PERRL. EOMI. Head: Positive trauma noted with no laceration. +3 cm area of hematoma noted to the right temporal region. Nose: No congestion/rhinnorhea. Mouth/Throat: Mucous membranes are moist.  Oropharynx non-erythematous. Neck: No stridor.  Full range of motion nontender. Cardiovascular: Normal rate, regular rhythm. Grossly normal heart  sounds.  Good peripheral circulation. Respiratory: Normal respiratory effort.  No retractions. Lungs CTAB. Gastrointestinal: Soft and nontender. No distention. No CVA tenderness. Musculoskeletal: No lower extremity tenderness nor edema.  No joint effusions. Neurologic:  Normal speech and language. No gross focal neurologic deficits are appreciated. No gait instability. Skin:  Skin is warm, dry and intact. No rash noted. Psychiatric: Mood and affect are normal. Speech and behavior are normal.  ____________________________________________   LABS (all labs ordered are listed, but only abnormal results are displayed)  Labs Reviewed - No data to  display ____________________________________________  EKG   ____________________________________________  RADIOLOGY  Head CT negative for any acute osseous or relieving qualities ____________________________________________   PROCEDURES  Procedure(s) performed: None  Critical Care performed: No  ____________________________________________   INITIAL IMPRESSION / ASSESSMENT AND PLAN / ED COURSE  Pertinent labs & imaging results that were available during my care of the patient were reviewed by me and considered in my medical decision making (see chart for details).  Status post fall with head contusion and hematoma. Rx given for Motrin and Phenergan. To follow up with PCP or return here with any worsening symptomology. ____________________________________________   FINAL CLINICAL IMPRESSION(S) / ED DIAGNOSES  Final diagnoses:  Head contusion, initial encounter     This chart was dictated using voice recognition software/Dragon. Despite best efforts to proofread, errors can occur which can change the meaning. Any change was purely unintentional.   Evangeline Dakin, PA-C 05/23/16 1527  Arnaldo Natal, MD 05/23/16 [phone]

## 2016-06-25 ENCOUNTER — Emergency Department
Admission: EM | Admit: 2016-06-25 | Discharge: 2016-06-25 | Disposition: A | Discharge status: Home / Self Care | Payer: Medicaid Other | Attending: Emergency Medicine | Admitting: Emergency Medicine

## 2016-06-25 ENCOUNTER — Encounter: Payer: Self-pay | Admitting: Emergency Medicine

## 2016-06-25 DIAGNOSIS — F1110 Opioid abuse, uncomplicated: Secondary | ICD-10-CM | POA: Diagnosis not present

## 2016-06-25 DIAGNOSIS — L03113 Cellulitis of right upper limb: Secondary | ICD-10-CM | POA: Diagnosis not present

## 2016-06-25 DIAGNOSIS — F17210 Nicotine dependence, cigarettes, uncomplicated: Secondary | ICD-10-CM | POA: Insufficient documentation

## 2016-06-25 DIAGNOSIS — Z791 Long term (current) use of non-steroidal anti-inflammatories (NSAID): Secondary | ICD-10-CM | POA: Diagnosis not present

## 2016-06-25 MED ORDER — ONDANSETRON 4 MG PO TBDP: 4.0000 mg | ORAL_TABLET | Freq: Three times a day (TID) | ORAL | 0 refills | Status: DC | PRN

## 2016-06-25 MED ORDER — ONDANSETRON 4 MG PO TBDP
4.0000 mg | ORAL_TABLET | Freq: Once | ORAL | Status: AC
  Administered 2016-06-25: 4 mg via ORAL

## 2016-06-25 MED ORDER — CLINDAMYCIN HCL 300 MG PO CAPS: 300.0000 mg | ORAL_CAPSULE | Freq: Three times a day (TID) | ORAL | 0 refills | Status: AC

## 2016-06-25 MED ORDER — CLINDAMYCIN HCL 150 MG PO CAPS
300.0000 mg | ORAL_CAPSULE | Freq: Once | ORAL | Status: AC
  Administered 2016-06-25: 300 mg via ORAL
  Filled 2016-06-25: qty 2

## 2016-06-25 MED ORDER — ONDANSETRON 4 MG PO TBDP
ORAL_TABLET | ORAL | Status: AC
  Administered 2016-06-25: 4 mg via ORAL
  Filled 2016-06-25: qty 1

## 2016-06-25 NOTE — ED Notes (Signed)
MD at bedside. 

## 2016-06-25 NOTE — ED Notes (Signed)
Patient has reddened swollen area to right wrist.  Patient is in no obvious distress at this time.  MD aware of area.

## 2016-06-25 NOTE — ED Triage Notes (Signed)
Pt to ed via ems with reports of wanting to detox from heroin. Pt last used this am around 11 and uses app every 4 hours. Pt has been using for about 8 yrs and has tried to detox in the past and was clean for about two years until she moved back here.  Pt denies any thoughts of SI or HI at this time.

## 2016-06-25 NOTE — ED Notes (Signed)
Patient told this RN she did not want her D/C papers or prescription and would not be getting it filled. I tried to hand patient her D/C papers and she walked away

## 2016-06-25 NOTE — ED Triage Notes (Signed)
Pt also wants to be seen for abscess to right wrist from shooting up heroin.

## 2016-06-25 NOTE — ED Notes (Signed)
VOL/To be D/C

## 2016-06-25 NOTE — ED Provider Notes (Signed)
[facility] Emergency Department Provider Note  ____________________________________________   I have reviewed the triage vital signs and the nursing notes.   HISTORY  Chief Complaint Detox (from heroin) and Abscess    HPI Penny Tucker is a 35 y.o. 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 Has no SI or HI but would like detox also once reevaluated for sialitis to the right upper extremity. She believes that there is no abscess. She does not want any I&D done or needle aspiration. She just wants antibiotics. She does not know she has a history of MRSA. She's had no shortness of breath or chest pain. She does not of any systemic fevers. Patient states she would like us to refer her to a detox center.     Past Medical History:  Diagnosis Date  . Drug use     Patient Active Problem List   Diagnosis Date Noted  . Cellulitis of right ankle 09/30/2015  . Ankle abscess 09/30/2015    Past Surgical History:  Procedure Laterality Date  . CESAREAN SECTION    . CHOLECYSTECTOMY    . INCISION AND DRAINAGE ABSCESS Right 10/02/2015   Procedure: INCISION AND DRAINAGE ABSCESS;  Surgeon: Christena FlakeJohn J Poggi, MD;  Location: [facility] ORS;  Service: Orthopedics;  Laterality: Right;    Prior to Admission medications   Medication Sig Start Date End Date Taking? Authorizing Provider  ibuprofen (ADVIL,MOTRIN) 800 MG tablet Take 1 tablet (800 mg total) by mouth every 8 (eight) hours as needed. 05/23/16   Evangeline Dakinharles M Beers, PA-C  promethazine (PHENERGAN) 25 MG tablet Take 1 tablet (25 mg total) by mouth every 6 (six) hours as needed for nausea or vomiting. 05/23/16   Evangeline Dakinharles M Beers, PA-C    Allergies Review of patient's allergies indicates no known allergies.  No family history on file.  Social History Social History  Substance Use Topics  . Smoking status: Current Every Day Smoker    Packs/day: 1.00    Types:  Cigarettes  . Smokeless tobacco: Never Used  . Alcohol use No    Review of Systems Constitutional: No fever/chills Eyes: No visual changes. ENT: No sore throat. No stiff neck no neck pain Cardiovascular: Denies chest pain. Respiratory: Denies shortness of breath. Gastrointestinal:   no vomiting.  No diarrhea.  No constipation. Genitourinary: Negative for dysuria. Musculoskeletal: Negative lower extremity swelling Skin: See history of present illness Neurological: Negative for severe headaches, focal weakness or numbness. 10-point ROS otherwise negative.  ____________________________________________   PHYSICAL EXAM:  VITAL SIGNS: ED Triage Vitals [06/25/16 1605]  Enc Vitals Group     BP 135/81     Pulse Rate (!) 107     Resp 18     Temp 98.2 F (36.8 C)     Temp Source Oral     SpO2 100 %     Weight 162 lb (73.5 kg)     Height 5\' 9"  (1.753 m)     Head Circumference      Peak Flow      Pain Score 7     Pain Loc      Pain Edu?      Excl. in GC?     Constitutional: Alert and oriented. Well appearing and in no acute distress. Eyes: Conjunctivae are normal. PERRL. EOMI. Head: Atraumatic. Nose: No congestion/rhinnorhea. Mouth/Throat: Mucous membranes are moist.  Oropharynx non-erythematous. Neck: No stridor.   Nontender with no  meningismus Cardiovascular: Normal rate, regular rhythm. Grossly normal heart sounds.  Good peripheral circulation. Respiratory: Normal respiratory effort.  No retractions. Lungs CTAB. Abdominal: Soft and nontender. No distention. No guarding no rebound Back:  There is no focal tenderness or step off.  there is no midline tenderness there are no lesions noted. there is no CVA tenderness Musculoskeletal: No lower extremity tenderness, no upper extremity tenderness. No joint effusions, no DVT signs strong distal pulses no edema Neurologic:  Normal speech and language. No gross focal neurologic deficits are appreciated.  Skin:  Skin is warm, dry  and intact. He is noted to the radial aspect of the right wrist which is indurated and elevated but not fluctuant. There is some surrounding erythema. There is some warmth. Area of induration is roughly 1.5-2 cm. Psychiatric: Mood and affect are normal. Speech and behavior are normal.  ____________________________________________   LABS (all labs ordered are listed, but only abnormal results are displayed)  Labs Reviewed - No data to display ____________________________________________  EKG  I personally interpreted any EKGs ordered by me or triage  ____________________________________________  RADIOLOGY  I reviewed any imaging ordered by me or triage that were performed during my shift and, if possible, patient and/or family made aware of any abnormal findings. ____________________________________________   PROCEDURES  Procedure(s) performed: None  Procedures  Critical Care performed: None  ____________________________________________   INITIAL IMPRESSION / ASSESSMENT AND PLAN / ED COURSE  Pertinent labs & imaging results that were available during my care of the patient were reviewed by me and considered in my medical decision making (see chart for details).  Patient with a soft tissue infection. No heart murmur nothing to suggest the patient has endocarditis. Did offer aspiration/I&D to ensure that there is no fluid collection also offered ultrasound patient would prefer not to have any of that done. She did just like to have antibiotics. Does not meet criteria for admission. 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 No evidence of overdose. Last used this morning. Extensive return precautions given for her cellulitis. We'll start on clindamycin which we will cover for MRSA. There is nothing at this time to culture.  Clinical Course    ____________________________________________   FINAL CLINICAL IMPRESSION(S) / ED DIAGNOSES  Final diagnoses:  None      This chart was dictated using voice recognition software.  Despite best efforts to proofread,  errors can occur which can change meaning.      Jeanmarie PlantJames A McShane, MD 06/25/16 [phone]

## 2016-06-29 ENCOUNTER — Emergency Department
Admission: EM | Admit: 2016-06-29 | Discharge: 2016-06-29 | Disposition: A | Discharge status: Home / Self Care | Payer: Medicaid Other | Attending: Emergency Medicine | Admitting: Emergency Medicine

## 2016-06-29 DIAGNOSIS — F17210 Nicotine dependence, cigarettes, uncomplicated: Secondary | ICD-10-CM | POA: Insufficient documentation

## 2016-06-29 DIAGNOSIS — L0291 Cutaneous abscess, unspecified: Secondary | ICD-10-CM

## 2016-06-29 DIAGNOSIS — L02413 Cutaneous abscess of right upper limb: Secondary | ICD-10-CM | POA: Diagnosis present

## 2016-06-29 LAB — BASIC METABOLIC PANEL
ANION GAP: 8 (ref 5–15)
BUN: <5 mg/dL — ABNORMAL LOW (ref 6–20)
CALCIUM: 8.6 mg/dL — AB (ref 8.9–10.3)
CO2: 25 mmol/L (ref 22–32)
CREATININE: 0.39 mg/dL — AB (ref 0.44–1.00)
Chloride: 101 mmol/L (ref 101–111)
Glucose, Bld: 126 mg/dL — ABNORMAL HIGH (ref 65–99)
Potassium: 2.9 mmol/L — ABNORMAL LOW (ref 3.5–5.1)
SODIUM: 134 mmol/L — AB (ref 135–145)

## 2016-06-29 LAB — CBC
HCT: 36.3 % (ref 35.0–47.0)
Hemoglobin: 13 g/dL (ref 12.0–16.0)
MCH: 31.4 pg (ref 26.0–34.0)
MCHC: 35.8 g/dL (ref 32.0–36.0)
MCV: 87.7 fL (ref 80.0–100.0)
PLATELETS: 347 10*3/uL (ref 150–440)
RBC: 4.14 MIL/uL (ref 3.80–5.20)
RDW: 12.9 % (ref 11.5–14.5)
WBC: 10.4 10*3/uL (ref 3.6–11.0)

## 2016-06-29 MED ORDER — TRAMADOL HCL 50 MG PO TABS: 50.0000 mg | ORAL_TABLET | Freq: Four times a day (QID) | ORAL | 0 refills | Status: AC | PRN

## 2016-06-29 MED ORDER — LIDOCAINE-EPINEPHRINE (PF) 1 %-1:200000 IJ SOLN
INTRAMUSCULAR | Status: AC
  Administered 2016-06-29: 30 mL via INTRADERMAL
  Filled 2016-06-29: qty 30

## 2016-06-29 MED ORDER — TRAMADOL HCL 50 MG PO TABS
ORAL_TABLET | ORAL | Status: AC
  Administered 2016-06-29: 50 mg via ORAL
  Filled 2016-06-29: qty 1

## 2016-06-29 MED ORDER — TRAMADOL HCL 50 MG PO TABS
50.0000 mg | ORAL_TABLET | ORAL | Status: AC
  Administered 2016-06-29: 50 mg via ORAL

## 2016-06-29 MED ORDER — LIDOCAINE-EPINEPHRINE (PF) 1 %-1:200000 IJ SOLN
30.0000 mL | Freq: Once | INTRAMUSCULAR | Status: AC
  Administered 2016-06-29: 30 mL via INTRADERMAL

## 2016-06-29 NOTE — ED Notes (Signed)
Report from Kimrey, RN. Care assumed by this RN. 

## 2016-06-29 NOTE — ED Triage Notes (Signed)
Pt has abscess to right wrist from IV drug use. Was seen here 4 days ago and put on bactrim. Pt states abscess has worsened and is taking antibiotics as prescribed.

## 2016-06-29 NOTE — ED Notes (Signed)
Pt alert and oriented X4, active, cooperative, pt in NAD. RR even and unlabored, color WNL.  Pt informed to return if any life threatening symptoms occur.  Pt awaiting ride in lobby who dropped her off.

## 2016-06-29 NOTE — ED Provider Notes (Signed)
[facility] Emergency Department Provider Note   ____________________________________________   I have reviewed the triage vital signs and the nursing notes.   HISTORY  Chief Complaint Abscess   History limited by: Not Limited   HPI Penny Tucker is a 35 y.o. female who presents to the emergency department today because of concern for abscess to her right forearm. Patient does have a history of IV drug use. Denies any concern for broken needle. Was seen in the emergency department 4 days ago and was discharged on antibiotics. Declined any I and D during that visit. Since discharge the area has become more painful and the swelling has been getting worse. She has been taking the antibiotics prescribed to her. She feels like she has had a fever.    Past Medical History:  Diagnosis Date  . Drug use     Patient Active Problem List   Diagnosis Date Noted  . Cellulitis of right ankle 09/30/2015  . Ankle abscess 09/30/2015    Past Surgical History:  Procedure Laterality Date  . CESAREAN SECTION    . CHOLECYSTECTOMY    . INCISION AND DRAINAGE ABSCESS Right 10/02/2015   Procedure: INCISION AND DRAINAGE ABSCESS;  Surgeon: Christena FlakeJohn J Poggi, MD;  Location: [facility] ORS;  Service: Orthopedics;  Laterality: Right;    Prior to Admission medications   Medication Sig Start Date End Date Taking? Authorizing Provider  clindamycin (CLEOCIN) 300 MG capsule Take 1 capsule (300 mg total) by mouth 3 (three) times daily. 06/25/16 07/05/16  Jeanmarie PlantJames A McShane, MD  ibuprofen (ADVIL,MOTRIN) 800 MG tablet Take 1 tablet (800 mg total) by mouth every 8 (eight) hours as needed. 05/23/16   Charmayne Sheerharles M Beers, PA-C  ondansetron (ZOFRAN ODT) 4 MG disintegrating tablet Take 1 tablet (4 mg total) by mouth every 8 (eight) hours as needed for nausea or vomiting. 06/25/16   Jeanmarie PlantJames A McShane, MD  promethazine (PHENERGAN) 25 MG tablet Take 1 tablet (25 mg total) by mouth every 6 (six) hours as needed for  nausea or vomiting. 05/23/16   Evangeline Dakinharles M Beers, PA-C    Allergies Review of patient's allergies indicates no known allergies.  No family history on file.  Social History Social History  Substance Use Topics  . Smoking status: Current Every Day Smoker    Packs/day: 1.00    Types: Cigarettes  . Smokeless tobacco: Never Used  . Alcohol use No    Review of Systems Constitutional:Positive for fever. Cardiovascular: Negative for chest pain. Respiratory: Negative for shortness of breath. Gastrointestinal: Negative for abdominal pain, vomiting and diarrhea. Skin: Positive for an area of redness and pain over the right forearm Neurological: Negative for headaches, focal weakness or numbness.  10-point ROS otherwise negative.  ____________________________________________   PHYSICAL EXAM:  VITAL SIGNS: ED Triage Vitals [06/29/16 0538]  Enc Vitals Group     BP 139/83     Pulse Rate 99     Resp 18     Temp 98.4 F (36.9 C)     Temp Source Oral     SpO2 96 %     Weight 150 lb (68 kg)     Height 5\' 9"  (1.753 m)     Head Circumference      Peak Flow      Pain Score 8   Constitutional: Alert and oriented. Well appearing and in no distress. Eyes: Conjunctivae are normal. PERRL. Normal extraocular movements. ENT   Head: Normocephalic and atraumatic.   Nose: No congestion/rhinnorhea.  Mouth/Throat: Mucous membranes are moist.   Neck: No stridor. Hematological/Lymphatic/Immunilogical: No cervical lymphadenopathy. Cardiovascular: Normal rate, regular rhythm.  No murmurs, rubs, or gallops. Respiratory: Normal respiratory effort without tachypnea nor retractions. Breath sounds are clear and equal bilaterally. No wheezes/rales/rhonchi. Gastrointestinal: Soft and nontender. No distention.  Genitourinary: Deferred Musculoskeletal: Normal range of motion in all extremities. No joint effusions.  No lower extremity tenderness nor edema. Neurologic:  Normal speech and  language. No gross focal neurologic deficits are appreciated.  Skin:  Right forearm with roughly 3 cm diameter area of swelling and erythema. It is fluctuant. Consistent with abscess Psychiatric: Mood and affect are normal. Speech and behavior are normal. Patient exhibits appropriate insight and judgment.  ____________________________________________    LABS (pertinent positives/negatives)  Labs Reviewed  BASIC METABOLIC PANEL - Abnormal; Notable for the following:       Result Value   Sodium 134 (*)    Potassium 2.9 (*)    Glucose, Bld 126 (*)    BUN <5 (*)    Creatinine, Ser 0.39 (*)    Calcium 8.6 (*)    All other components within normal limits  CBC     ____________________________________________   EKG  None  ____________________________________________    RADIOLOGY  None   ____________________________________________   PROCEDURES  .Marland KitchenIncision and Drainage Date/Time: 06/29/2016 7:46 AM Performed by: Phineas Semen Authorized by: Phineas Semen   Consent:    Consent obtained:  Verbal   Consent given by:  Patient Location:    Type:  Abscess   Size:  3   Location:  Upper extremity   Upper extremity location:  Arm   Arm location:  R upper arm Pre-procedure details:    Skin preparation:  Chloraprep Anesthesia (see MAR for exact dosages):    Anesthesia method:  Local infiltration   Local anesthetic:  Lidocaine 1% WITH epi Procedure type:    Complexity:  Complex Procedure details:    Needle aspiration: no     Incision types:  Single straight   Incision depth:  Dermal   Scalpel blade:  11   Wound management:  Probed and deloculated and irrigated with saline   Drainage:  Purulent   Drainage amount:  Moderate   Wound treatment:  Wound left open   Packing materials:  1/4 in iodoform gauze   Amount 1/4" iodoform:  7 Post-procedure details:    Patient tolerance of procedure:  Tolerated well, no immediate  complications    ____________________________________________   INITIAL IMPRESSION / ASSESSMENT AND PLAN / ED COURSE  Pertinent labs & imaging results that were available during my care of the patient were reviewed by me and considered in my medical decision making (see chart for details).  Patient presented to the emergency department today because of concerns for abscess of her right forearm. This was incised and drained. Patient still has antibiotics prescribed from last visit. ____________________________________________   FINAL CLINICAL IMPRESSION(S) / ED DIAGNOSES  Abscess  Note: This dictation was prepared with Dragon dictation. Any transcriptional errors that result from this process are unintentional    Phineas Semen, MD 06/29/16 [phone]

## 2016-06-29 NOTE — Discharge Instructions (Signed)
Please leave the packing in for 24 hours, you can then start to pull it out (1/4 inch per day) until it falls out. If it falls out earlier that is okay, it does not need to be placed back in. Please seek medical attention for any high fevers, chest pain, shortness of breath, change in behavior, persistent vomiting, bloody stool or any other new or concerning symptoms.

## 2016-06-29 NOTE — ED Notes (Signed)
MD at bedside for procedure.

## 2016-06-29 NOTE — ED Notes (Signed)
MD at bedside. 

## 2016-07-22 DIAGNOSIS — F1410 Cocaine abuse, uncomplicated: Secondary | ICD-10-CM | POA: Insufficient documentation

## 2016-07-22 DIAGNOSIS — A599 Trichomoniasis, unspecified: Secondary | ICD-10-CM | POA: Insufficient documentation

## 2016-07-22 DIAGNOSIS — N760 Acute vaginitis: Secondary | ICD-10-CM

## 2016-07-22 DIAGNOSIS — F1123 Opioid dependence with withdrawal: Secondary | ICD-10-CM | POA: Insufficient documentation

## 2016-07-22 DIAGNOSIS — B9689 Other specified bacterial agents as the cause of diseases classified elsewhere: Secondary | ICD-10-CM | POA: Insufficient documentation

## 2016-07-22 DIAGNOSIS — O99321 Drug use complicating pregnancy, first trimester: Secondary | ICD-10-CM

## 2016-07-22 DIAGNOSIS — F1910 Other psychoactive substance abuse, uncomplicated: Secondary | ICD-10-CM | POA: Insufficient documentation

## 2016-07-22 DIAGNOSIS — O0992 Supervision of high risk pregnancy, unspecified, second trimester: Secondary | ICD-10-CM | POA: Insufficient documentation

## 2016-07-22 DIAGNOSIS — F1193 Opioid use, unspecified with withdrawal: Secondary | ICD-10-CM | POA: Insufficient documentation

## 2016-07-22 DIAGNOSIS — E0590 Thyrotoxicosis, unspecified without thyrotoxic crisis or storm: Secondary | ICD-10-CM | POA: Insufficient documentation

## 2016-07-22 DIAGNOSIS — E876 Hypokalemia: Secondary | ICD-10-CM | POA: Insufficient documentation

## 2016-08-12 LAB — HM PAP SMEAR: HM PAP: NEGATIVE

## 2016-12-25 LAB — CBC AND DIFFERENTIAL
HEMATOCRIT: 36 (ref 36–46)
Hemoglobin: 12 (ref 12.0–16.0)
Neutrophils Absolute: 8
Platelets: 385 (ref 150–399)
WBC: 12

## 2016-12-25 LAB — HEPATIC FUNCTION PANEL
ALT: 12 (ref 7–35)
AST: 17 (ref 13–35)
Alkaline Phosphatase: 124 (ref 25–125)
Bilirubin, Total: 0.6

## 2016-12-25 LAB — BASIC METABOLIC PANEL
BUN: 8 (ref 4–21)
CREATININE: 0.6 (ref 0.5–1.1)
Glucose: 78
POTASSIUM: 4.4 (ref 3.4–5.3)
SODIUM: 134 — AB (ref 137–147)

## 2016-12-31 DIAGNOSIS — O1493 Unspecified pre-eclampsia, third trimester: Secondary | ICD-10-CM | POA: Insufficient documentation

## 2016-12-31 DIAGNOSIS — N970 Female infertility associated with anovulation: Secondary | ICD-10-CM | POA: Insufficient documentation

## 2017-01-02 DIAGNOSIS — Z720 Tobacco use: Secondary | ICD-10-CM | POA: Insufficient documentation

## 2017-03-21 LAB — TSH: TSH: 0.47 (ref <=5.90)

## 2017-06-17 ENCOUNTER — Ambulatory Visit: Payer: Self-pay | Admitting: Family Medicine

## 2017-06-28 ENCOUNTER — Ambulatory Visit: Payer: Self-pay | Admitting: Family Medicine

## 2017-07-25 ENCOUNTER — Encounter: Payer: Self-pay | Admitting: Family Medicine

## 2017-07-25 ENCOUNTER — Ambulatory Visit (INDEPENDENT_AMBULATORY_CARE_PROVIDER_SITE_OTHER): Payer: Medicaid Other | Admitting: Family Medicine

## 2017-07-25 VITALS — BP 108/68 | HR 92 | Temp 98.1°F | Resp 16 | Ht 68.0 in | Wt 216.0 lb

## 2017-07-25 DIAGNOSIS — F329 Major depressive disorder, single episode, unspecified: Secondary | ICD-10-CM | POA: Insufficient documentation

## 2017-07-25 DIAGNOSIS — Z803 Family history of malignant neoplasm of breast: Secondary | ICD-10-CM

## 2017-07-25 DIAGNOSIS — M542 Cervicalgia: Secondary | ICD-10-CM | POA: Diagnosis not present

## 2017-07-25 DIAGNOSIS — F332 Major depressive disorder, recurrent severe without psychotic features: Secondary | ICD-10-CM | POA: Diagnosis not present

## 2017-07-25 DIAGNOSIS — Z7689 Persons encountering health services in other specified circumstances: Secondary | ICD-10-CM | POA: Diagnosis not present

## 2017-07-25 DIAGNOSIS — F1911 Other psychoactive substance abuse, in remission: Secondary | ICD-10-CM | POA: Insufficient documentation

## 2017-07-25 DIAGNOSIS — F419 Anxiety disorder, unspecified: Secondary | ICD-10-CM

## 2017-07-25 DIAGNOSIS — F32A Depression, unspecified: Secondary | ICD-10-CM | POA: Insufficient documentation

## 2017-07-25 DIAGNOSIS — J4520 Mild intermittent asthma, uncomplicated: Secondary | ICD-10-CM

## 2017-07-25 DIAGNOSIS — M5126 Other intervertebral disc displacement, lumbar region: Secondary | ICD-10-CM | POA: Diagnosis not present

## 2017-07-25 DIAGNOSIS — J45909 Unspecified asthma, uncomplicated: Secondary | ICD-10-CM | POA: Insufficient documentation

## 2017-07-25 DIAGNOSIS — Z87898 Personal history of other specified conditions: Secondary | ICD-10-CM

## 2017-07-25 MED ORDER — TRAMADOL HCL 50 MG PO TABS: 50.0000 mg | ORAL_TABLET | Freq: Three times a day (TID) | ORAL | 0 refills | Status: DC | PRN

## 2017-07-25 MED ORDER — CITALOPRAM HYDROBROMIDE 20 MG PO TABS: 20.0000 mg | ORAL_TABLET | Freq: Every day | ORAL | 3 refills | Status: DC

## 2017-07-25 NOTE — Patient Instructions (Signed)

## 2017-07-25 NOTE — Assessment & Plan Note (Signed)
Reports she has not used drugs in ~2 years Will avoid addictive controlled substances including benzos and narcotics

## 2017-07-25 NOTE — Assessment & Plan Note (Addendum)
Significant disc disease with some stenosis on most recent MRI Patient with chronic pain throughout C, T, L-spine We will treat pain with gabapentin as prescribed Patient unable to tolerate sedation of higher dose Discussed that we will avoid opioid therapy given history of drug abuse Prescription for as needed tramadol given to the patient to use sparingly and discussed that we will not escalate dose or number of pills per month She reports lack of efficacy of ESI in the past, so we will not refer to pain management at this time She has also felt Cymbalta and amitriptyline in the past We'll refer to neurosurgery for possible surgical intervention as she does have weakness of her right side that is chronic Defer further imaging to neurosurgery

## 2017-07-25 NOTE — Assessment & Plan Note (Signed)
Discussed that multiple family members have h/o early breast cancer around age 36 Will plan to start early mammography screening Will discuss further at CPE
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
## 2017-07-25 NOTE — Assessment & Plan Note (Signed)
Significant anxiety based on GAD 7 testing SSRI as above Discussed with patient that we will avoid addictive controlled substances such as benzos Follow-up in one month

## 2017-07-25 NOTE — Assessment & Plan Note (Signed)
Significant depression based on pH 29 testing Patient adamantly denies SI Contracted for safety if she were to develop SI Discussed synergistic effect of SSRIs with counseling Referral to psychology for therapy Start Celexa 20 mg daily-discussed that it often takes 6-8 weeks to reach maximum potential, potential side effects, potential for increased suicidality Follow-up in one month and repeat pH Q9 and consider increasing dose of Celexa

## 2017-07-25 NOTE — Assessment & Plan Note (Signed)
Diffuse TTP on exam No MRI results  Available  we will refer to neurosurgry given weakness on exam  Defer further imaging to NSG pain management as above

## 2017-07-25 NOTE — Assessment & Plan Note (Signed)
Well controlled Continue albuterol prn 

## 2017-08-01 ENCOUNTER — Encounter: Payer: Self-pay | Admitting: Family Medicine

## 2017-08-07 ENCOUNTER — Ambulatory Visit: Payer: Self-pay | Admitting: Family Medicine

## 2017-08-07 ENCOUNTER — Encounter: Payer: Self-pay | Admitting: Family Medicine

## 2017-08-16 IMAGING — CR DG ANKLE COMPLETE 3+V*R*
1 series · 3 of 3 positions shown · non-contrast
Comparison: None.

CLINICAL DATA: Patient was seen in the ED 5 days ago for large
abscess to the right ankle. The areas more painful and swelling with
redness noted.

EXAM:
RIGHT ANKLE - COMPLETE 3+ VIEW

[Series 1: ap · 0.17mm/px · 3 of 3 slices shown]
[im 1/3]
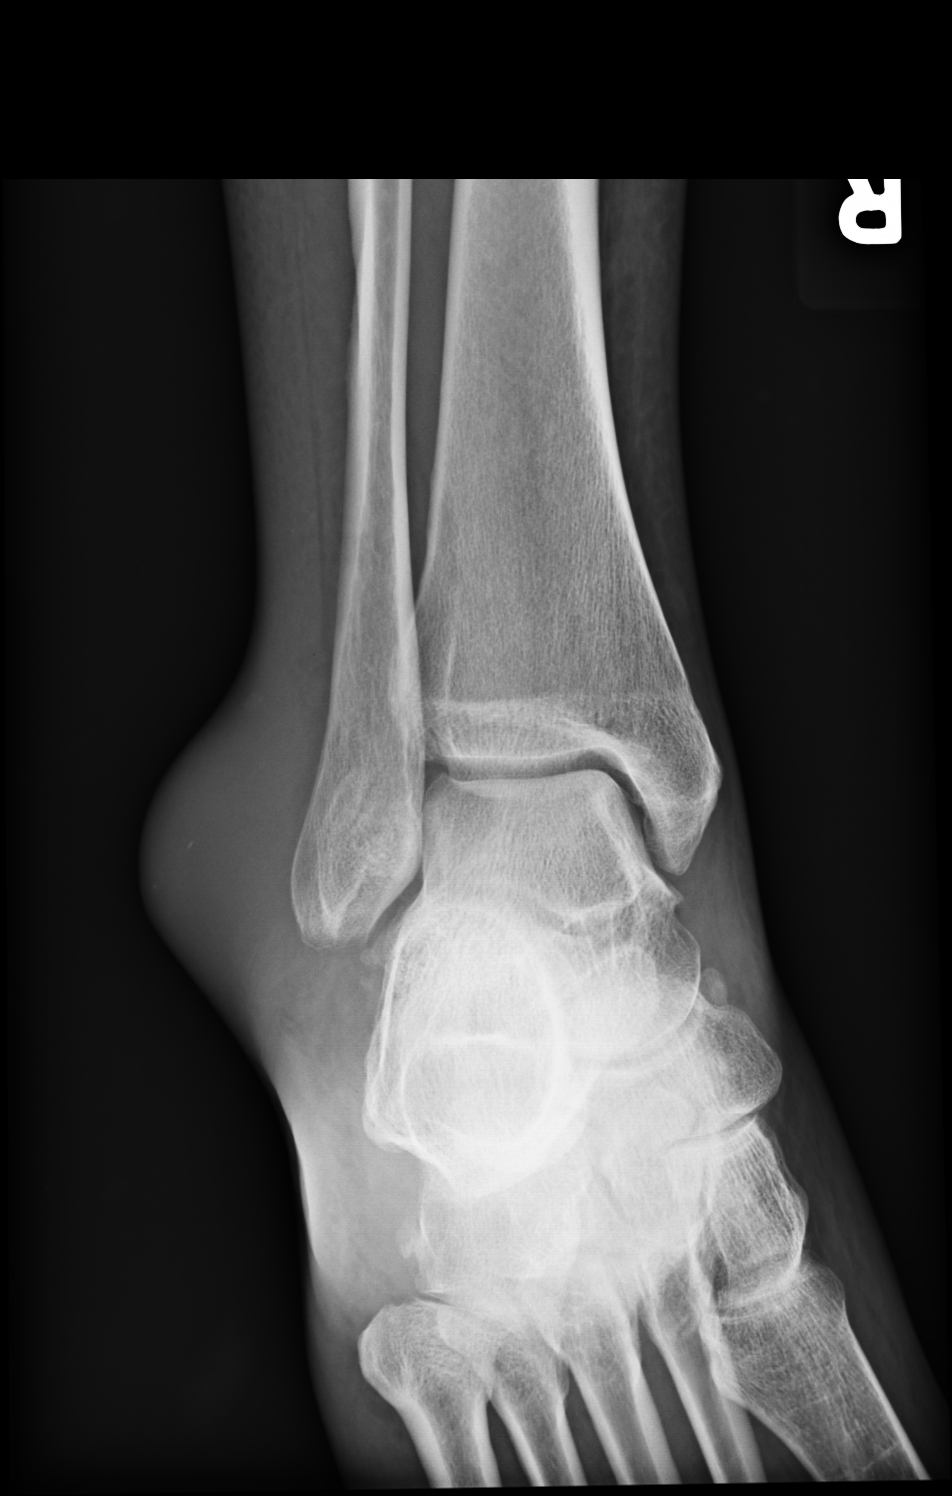
[im 2/3]
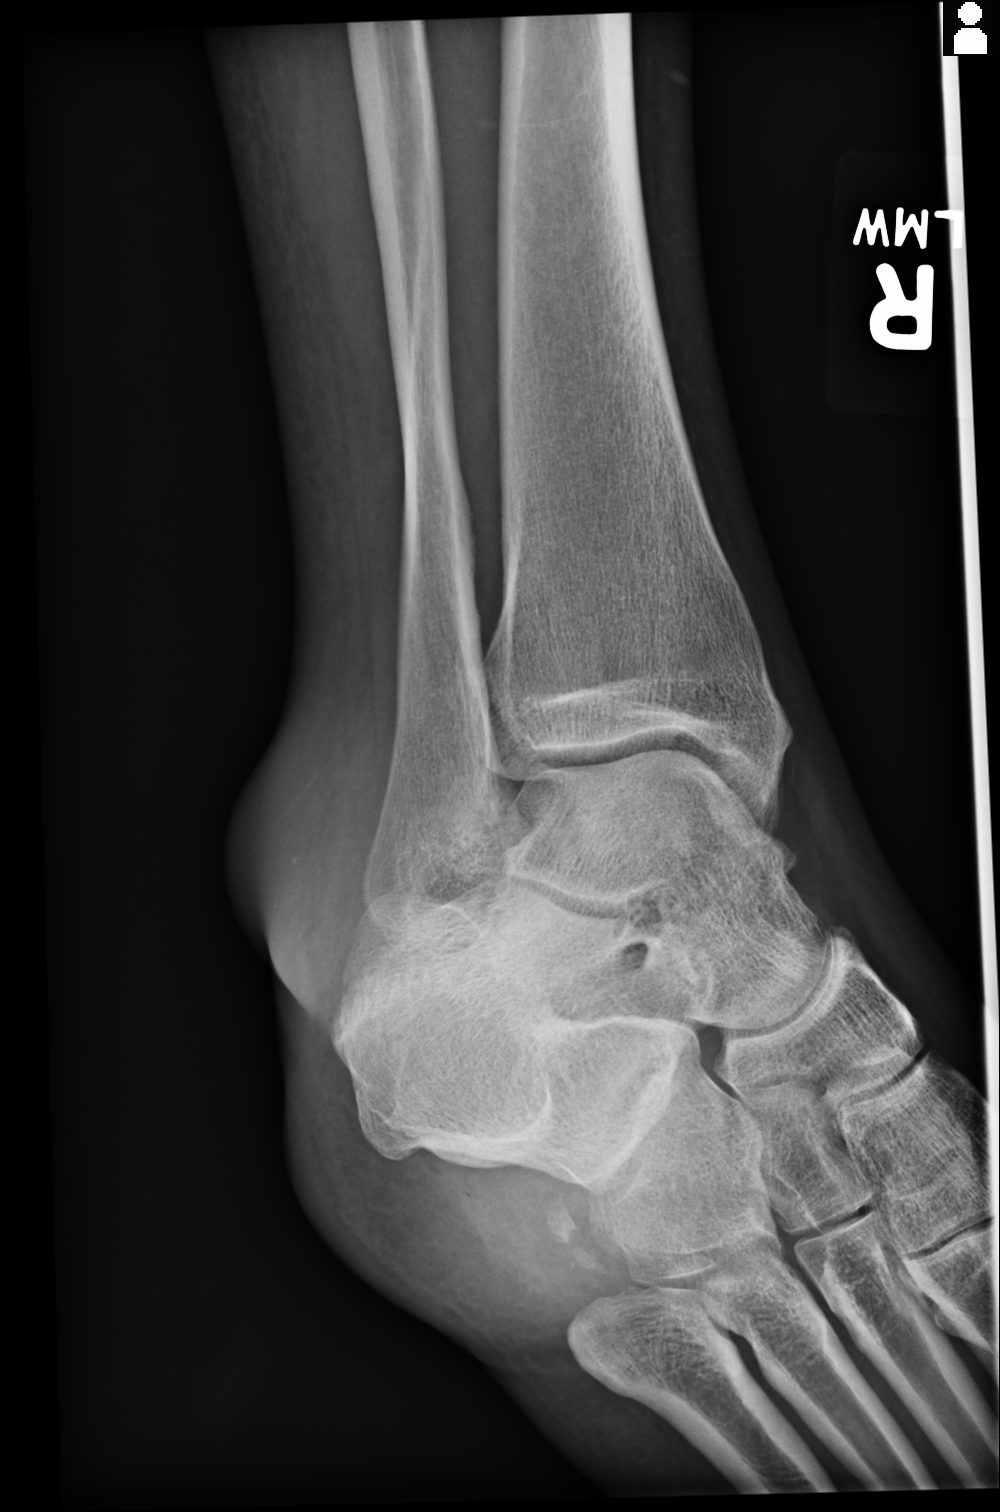
[im 3/3]
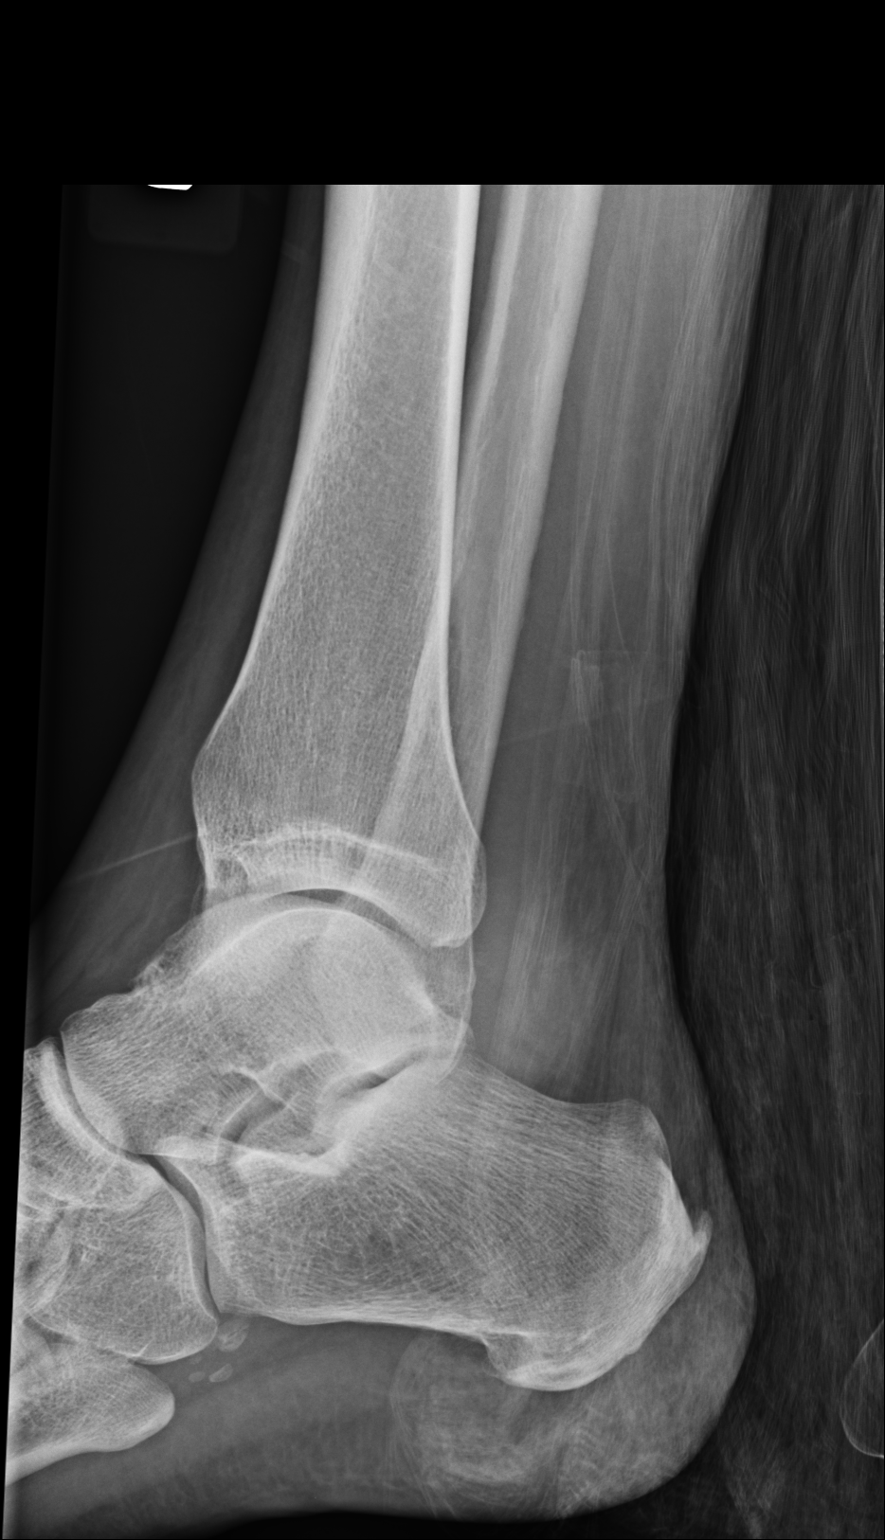

[3 of 3 positions shown; findings below may reference images not displayed]

FINDINGS: Large focal soft tissue swelling over the lateral malleolus. Tiny
calcifications within the area of swelling. No soft tissue gas
collections. No bone erosion or cortical changes. No findings to
suggest osteomyelitis. No evidence of fracture or dislocation of the
right ankle. Small old ununited ossicles inferior to the lateral
malleolus and adjacent to the cuboidal bone and navicular bone. No
focal bone lesion or bone destruction.
IMPRESSION: Large focal soft tissue swelling over the lateral malleolus. No soft
tissue gas collections. Underlying bones appear intact.

## 2017-08-21 ENCOUNTER — Telehealth: Payer: Self-pay | Admitting: Family Medicine

## 2017-08-21 ENCOUNTER — Ambulatory Visit (INDEPENDENT_AMBULATORY_CARE_PROVIDER_SITE_OTHER): Payer: Medicaid Other | Admitting: Licensed Clinical Social Worker

## 2017-08-21 DIAGNOSIS — F322 Major depressive disorder, single episode, severe without psychotic features: Secondary | ICD-10-CM

## 2017-08-21 DIAGNOSIS — Z87898 Personal history of other specified conditions: Secondary | ICD-10-CM

## 2017-08-21 DIAGNOSIS — F411 Generalized anxiety disorder: Secondary | ICD-10-CM

## 2017-08-21 DIAGNOSIS — F1991 Other psychoactive substance use, unspecified, in remission: Secondary | ICD-10-CM
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
## 2017-08-21 NOTE — Telephone Encounter (Signed)
[facility]

## 2017-08-26 ENCOUNTER — Ambulatory Visit (INDEPENDENT_AMBULATORY_CARE_PROVIDER_SITE_OTHER): Payer: Medicaid Other | Admitting: Family Medicine

## 2017-08-26 ENCOUNTER — Encounter: Payer: Self-pay | Admitting: Family Medicine

## 2017-08-26 VITALS — BP 122/70 | HR 88 | Temp 98.6°F | Resp 16 | Ht 68.0 in | Wt 222.0 lb

## 2017-08-26 DIAGNOSIS — M542 Cervicalgia: Secondary | ICD-10-CM | POA: Diagnosis not present

## 2017-08-26 DIAGNOSIS — Z87898 Personal history of other specified conditions: Secondary | ICD-10-CM | POA: Diagnosis not present

## 2017-08-26 DIAGNOSIS — M5126 Other intervertebral disc displacement, lumbar region: Secondary | ICD-10-CM | POA: Diagnosis not present

## 2017-08-26 DIAGNOSIS — J4531 Mild persistent asthma with (acute) exacerbation: Secondary | ICD-10-CM

## 2017-08-26 DIAGNOSIS — F419 Anxiety disorder, unspecified: Secondary | ICD-10-CM | POA: Diagnosis not present

## 2017-08-26 DIAGNOSIS — Z0000 Encounter for general adult medical examination without abnormal findings: Secondary | ICD-10-CM | POA: Insufficient documentation

## 2017-08-26 DIAGNOSIS — F332 Major depressive disorder, recurrent severe without psychotic features: Secondary | ICD-10-CM | POA: Diagnosis not present

## 2017-08-26 DIAGNOSIS — F1911 Other psychoactive substance abuse, in remission: Secondary | ICD-10-CM

## 2017-08-26 MED ORDER — GUAIFENESIN-CODEINE 100-10 MG/5ML PO SOLN: 5.0000 mL | Freq: Three times a day (TID) | ORAL | 0 refills | Status: DC | PRN

## 2017-08-26 MED ORDER — CITALOPRAM HYDROBROMIDE 40 MG PO TABS: 40.0000 mg | ORAL_TABLET | Freq: Every day | ORAL | 1 refills | Status: DC

## 2017-08-26 MED ORDER — ALBUTEROL SULFATE HFA 108 (90 BASE) MCG/ACT IN AERS: 2.0000 | INHALATION_SPRAY | Freq: Four times a day (QID) | RESPIRATORY_TRACT | 0 refills | Status: DC | PRN

## 2017-08-26 MED ORDER — PREDNISONE 50 MG PO TABS: 50.0000 mg | ORAL_TABLET | Freq: Every day | ORAL | 0 refills | Status: AC

## 2017-08-26 MED ORDER — BECLOMETHASONE DIPROPIONATE 80 MCG/ACT IN AERS: 2.0000 | INHALATION_SPRAY | Freq: Two times a day (BID) | RESPIRATORY_TRACT | 12 refills | Status: DC

## 2017-08-26 MED ORDER — TRAMADOL HCL 50 MG PO TABS: 50.0000 mg | ORAL_TABLET | Freq: Two times a day (BID) | ORAL | 0 refills | Status: DC | PRN

## 2017-08-26 NOTE — Assessment & Plan Note (Signed)
Patient with chronic pain throughout C, T, L-spine Most recent MRI demonstrates significant disc disease with some stenosis Discussed again that we will avoid opioid therapy given history of drug abuse Advised patient not to take her friend's medication Refilled tramadol to use sparingly Referral to pain management, as patient states she has failed all of the options that I have given her Awaiting neurosurgery appointment Defer further imaging to neurosurgery

## 2017-08-26 NOTE — Progress Notes (Signed)
.     Patient: Penny Tucker, Female    DOB: Mar 16, 1981, 36 y.o.   MRN: [id number] Visit Date: 08/26/2017  Today's Provider: Shirlee Latch, MD   Chief Complaint  Patient presents with  . Annual Exam  . Depression  . Anxiety   Subjective:   Anxiety/Depression, follow up: Patient was last seen 4 weeks ago. She was started on Celexa 20mg  daily. She was also referred to psychology and reports that she recommended that her medication be adjusted.  Thinks it has helped minimally.  Seemed to help more initially with anxiety than it does now.  Talked with psychologist. They agreed that depression is related to pain and inability to deal with daily pain.  States she doesn't even want to get out of bed in the morning and it is difficult to pick up her children.  States that it is so bad that she sometimes thinks about going out and "screwing up" (taking heroin).  Has been getting pain medication (Oxycodone 10mg ) from friend to get by (states that this was helping).  States that she has tried amitriptyline, cymbalta, ESIs, PT in the past without improvement.  States she has been clean for ~2 years.States that she stopped her gabapentin as it is making her tired. She does not want to try other interventions, and states that she thinks she should be back on opiates. She states she has never had a problem with opiates, only IV heroin from the streets.  Depression screen [name] 2/9 08/26/2017 07/25/2017  Decreased Interest 2 3  Down, Depressed, Hopeless 2 1  PHQ - 2 Score 4 4  Altered sleeping 2 2  Tired, decreased energy 2 2  Change in appetite 2 3  Feeling bad or failure about yourself  0 0  Trouble concentrating 1 1  Moving slowly or fidgety/restless 0 2  Suicidal thoughts 0 0  PHQ-9 Score 11 14  Difficult doing work/chores Somewhat difficult Extremely dIfficult   GAD 7 : Generalized Anxiety Score 08/26/2017 07/25/2017  Nervous, Anxious, on Edge 3 3  Control/stop worrying 2 3  Worry too much  - different things 2 3  Trouble relaxing 2 3  Restless 2 2  Easily annoyed or irritable 3 3  Afraid - awful might happen 1 3  Total GAD 7 Score 15 20  Anxiety Difficulty Very difficult Extremely difficult    Cough: Patient reports that she has had a cough X 1 week. She has not been taking OTC allergy medication. She reports that the cough keeps her up at night. She denies any fever.   States that she gets like this annually when weather changes.  Using albuterol inhaler about q3h.  Helping somewhat but doesn't last long.  Denies any sputum production. She does feel tightness and wheezing in her chest. She has a history of asthma. She states she was previously on Qvar and/or Advair and has not been taking this since she got released from prison. She is this feels like previous asthma flares area   Review of Systems  Constitutional: Positive for appetite change and fatigue.  HENT: Positive for congestion and rhinorrhea.   Eyes: Negative.   Respiratory: Positive for cough, chest tightness and wheezing.   Cardiovascular: Negative.   Gastrointestinal: Negative.   Endocrine: Negative.   Genitourinary: Negative.   Musculoskeletal: Positive for arthralgias, back pain and myalgias.  Allergic/Immunologic: Positive for environmental allergies.  Neurological: Positive for dizziness, weakness, light-headedness and headaches.  Psychiatric/Behavioral: Positive for agitation and sleep disturbance.  Negative for decreased concentration, self-injury and suicidal ideas. The patient is nervous/anxious.     Social History      She  reports that she has been smoking Cigarettes.  She has a 10.00 pack-year smoking history. She has never used smokeless tobacco. She reports that she does not drink alcohol or use drugs.       Social History   Social History  . Marital status: Single    Spouse name: N/A  . Number of children: 2  . Years of education: N/A   Occupational History  . unemployed    Social  History Main Topics  . Smoking status: Current Every Day Smoker    Packs/day: 0.50    Years: 20.00    Types: Cigarettes  . Smokeless tobacco: Never Used     Comment: starting smoking at age 36  . Alcohol use No  . Drug use: No  . Sexual activity: Not Currently    Partners: Male    Birth control/ protection: Implant   Other Topics Concern  . None   Social History Narrative  . None     Patient Active Problem List   Diagnosis Date Noted  . Healthcare maintenance 08/26/2017  . Family history of breast cancer 07/25/2017  . Lumbar disc herniation 07/25/2017  . Neck pain 07/25/2017  . Depression 07/25/2017  . Reactive airway disease   . Anxiety   . H/O drug abuse     No Known Allergies   Current Outpatient Prescriptions:  .  citalopram (CELEXA) 40 MG tablet, Take 1 tablet (40 mg total) by mouth daily., Disp: 30 tablet, Rfl: 1 .  etonogestrel (NEXPLANON) 68 MG IMPL implant, 1 each by Subdermal route once., Disp: , Rfl:  .  ibuprofen (ADVIL,MOTRIN) 200 MG tablet, Take 600 mg by mouth every 6 (six) hours as needed., Disp: , Rfl:  .  traMADol (ULTRAM) 50 MG tablet, Take 1 tablet (50 mg total) by mouth every 12 (twelve) hours as needed., Disp: 60 tablet, Rfl: 0 .  albuterol (PROVENTIL HFA;VENTOLIN HFA) 108 (90 Base) MCG/ACT inhaler, Inhale 2 puffs into the lungs every 6 (six) hours as needed for wheezing or shortness of breath., Disp: 1 Inhaler, Rfl: 0 .  beclomethasone (QVAR) 80 MCG/ACT inhaler, Inhale 2 puffs into the lungs 2 (two) times daily., Disp: 1 Inhaler, Rfl: 12 .  guaiFENesin-codeine 100-10 MG/5ML syrup, Take 5 mLs by mouth 3 (three) times daily as needed for cough., Disp: 120 mL, Rfl: 0 .  predniSONE (DELTASONE) 50 MG tablet, Take 1 tablet (50 mg total) by mouth daily with breakfast., Disp: 7 tablet, Rfl: 0   Patient Care Team: Erasmo DownerBacigalupo, Regla Fitzgibbon M, MD as PCP - General (Family Medicine)      Objective:   Vitals: BP 122/70 (BP Location: Right Arm, Patient Position:  Sitting, Cuff Size: Normal)   Pulse 88   Temp 98.6 F (37 C)   Resp 16   Ht 5\' 8"  (1.727 m)   Wt 222 lb (100.7 kg)   BMI 33.75 kg/m    Vitals:   08/26/17 1113  BP: 122/70  Pulse: 88  Resp: 16  Temp: 98.6 F (37 C)  Weight: 222 lb (100.7 kg)  Height: 5\' 8"  (1.727 m)     Physical Exam  Constitutional: She is oriented to person, place, and time. She appears well-developed and well-nourished. No distress.  HENT:  Head: Normocephalic and atraumatic.  Right Ear: External ear normal.  Left Ear: External ear normal.  Nose: Nose  normal.  Mouth/Throat: Oropharynx is clear and moist.  Eyes: Conjunctivae are normal. No scleral icterus.  Neck: Neck supple. No thyromegaly present.  Cardiovascular: Normal rate, regular rhythm, normal heart sounds and intact distal pulses.   No murmur heard. Pulmonary/Chest: Effort normal. No respiratory distress. She has wheezes (in b/l bases). She has no rales. She exhibits no tenderness.  Abdominal: Soft. She exhibits no distension. There is no tenderness. There is no rebound and no guarding.  Musculoskeletal: She exhibits no edema or deformity.  Lymphadenopathy:    She has no cervical adenopathy.  Neurological: She is alert and oriented to person, place, and time. No cranial nerve deficit.  Skin: Skin is warm and dry. No rash noted.  Psychiatric:  Tearful intermittently  Vitals reviewed.    Assessment & Plan:    Problem List Items Addressed This Visit      Respiratory   Reactive airway disease    Currently with acute flare Continue albuterol prn Restart qvar Advised allergic rhinitis treatment with antihistamine and Flonase nasal spray Treat exacerbation with a one-week course of prednisone Return precautions discussed Follow-up in one month on feeling well to discuss improving control overall        Musculoskeletal and Integument   Lumbar disc herniation    Patient with chronic pain throughout C, T, L-spine Most recent MRI  demonstrates significant disc disease with some stenosis Discussed again that we will avoid opioid therapy given history of drug abuse Advised patient not to take her friend's medication Refilled tramadol to use sparingly Referral to pain management, as patient states she has failed all of the options that I have given her Awaiting neurosurgery appointment Defer further imaging to neurosurgery       Relevant Orders   Ambulatory referral to Pain Clinic     Other   Anxiety    Control somewhat improved based on GAD 7 testing Increase SSRI as above Follow-up in one month      Relevant Medications   citalopram (CELEXA) 40 MG tablet   H/O drug abuse    Avoid addictive controlled substances including benzos and narcotics given history of IV drug abuse Patient states she has been clean for about 2 years, but she is taking her friend's prescription oxycodone at this time      Relevant Orders   Ambulatory referral to Pain Clinic   Neck pain    Patient awaiting neurosurgery appointment as above Pain management as above      Relevant Orders   Ambulatory referral to Pain Clinic   Depression - Primary    Somewhat improved based on pH Q9 testing She denies SI Patient seeing psychology and reports that she will be seeing a psychiatrist soon as well Increase Celexa 20 mg daily Discussed that it can take 6-8 weeks to reach maximal potential Follow-up in one month and repeat phq9      Relevant Medications   citalopram (CELEXA) 40 MG tablet      Return in about 1 month (around 09/26/2017) for depression/anxiety, asthma f/u.  Of note, patient was initially scheduled for CPE, but due to multiple acute complaints and follow-up issues, we will defer CPE for separate visit when she is well  The entirety of the information documented in the History of Present Illness, Review of Systems and Physical Exam were personally obtained by me. Portions of this information were initially documented  by Anson [place], CMA and reviewed by me for thoroughness and accuracy.     Marylene Land  Beryle Flock, MD  [facility]

## 2017-08-26 NOTE — Assessment & Plan Note (Signed)
Patient awaiting neurosurgery appointment as above Pain management as above

## 2017-08-26 NOTE — Assessment & Plan Note (Signed)
Control somewhat improved based on GAD 7 testing Increase SSRI as above Follow-up in one month

## 2017-08-26 NOTE — Patient Instructions (Signed)

## 2017-08-26 NOTE — Assessment & Plan Note (Signed)
Avoid addictive controlled substances including benzos and narcotics given history of IV drug abuse Patient states she has been clean for about 2 years, but she is taking her friend's prescription oxycodone at this time

## 2017-08-26 NOTE — Assessment & Plan Note (Signed)
Currently with acute flare Continue albuterol prn Restart qvar Advised allergic rhinitis treatment with antihistamine and Flonase nasal spray Treat exacerbation with a one-week course of prednisone Return precautions discussed Follow-up in one month on feeling well to discuss improving control overall

## 2017-08-26 NOTE — Assessment & Plan Note (Signed)
Somewhat improved based on pH Q9 testing She denies SI Patient seeing psychology and reports that she will be seeing a psychiatrist soon as well Increase Celexa 20 mg daily Discussed that it can take 6-8 weeks to reach maximal potential Follow-up in one month and repeat phq9

## 2017-08-29 ENCOUNTER — Telehealth: Payer: Self-pay | Admitting: Family Medicine

## 2017-08-29 NOTE — Telephone Encounter (Signed)
Per [facility] the PA is for Tramadol, and they are re-faxing this because I have not received this fax.

## 2017-08-29 NOTE — Telephone Encounter (Signed)
Pt states [facility] has told her they sent a prior auth on Monday and they gave not heard back.  [id number]#[phone]/[id number]#[phone]/MW

## 2017-09-01 ENCOUNTER — Other Ambulatory Visit: Payer: Self-pay | Admitting: Family Medicine

## 2017-09-01 MED ORDER — AZITHROMYCIN 250 MG PO TABS: ORAL_TABLET | ORAL | 0 refills | Status: AC

## 2017-09-01 MED ORDER — ALBUTEROL SULFATE (2.5 MG/3ML) 0.083% IN NEBU: 2.5000 mg | INHALATION_SOLUTION | Freq: Four times a day (QID) | RESPIRATORY_TRACT | 1 refills | Status: DC | PRN

## 2017-09-01 NOTE — Progress Notes (Signed)
Patient called [facility] stating coughing is worse and is unable to go to ER due to not being able to leaving her children. She requested antibiotic and a different inhaler. No fevers, chills, sweats or dyspnea She has nebulizer at home but no ampules. Have sent prescription for azithromycin and albuterol ampules and to go to ER if she develops any of above symptoms.

## 2017-09-02 NOTE — Telephone Encounter (Signed)
Faxed to [facility]. Left message advising pt and asked her to call back if there are further issues with this.

## 2017-09-02 NOTE — Telephone Encounter (Signed)
Pt calling about prior auth for her medication.  States she has been waiting on this for a week.  Wants a call back today [phone].

## 2017-09-04 ENCOUNTER — Ambulatory Visit: Payer: Medicaid Other | Admitting: Licensed Clinical Social Worker

## 2017-09-04 ENCOUNTER — Encounter: Payer: Self-pay | Admitting: Psychiatry

## 2017-09-04 ENCOUNTER — Ambulatory Visit (INDEPENDENT_AMBULATORY_CARE_PROVIDER_SITE_OTHER): Payer: Medicaid Other | Admitting: Psychiatry

## 2017-09-04 VITALS — BP 125/86 | HR 91 | Temp 98.5°F | Wt 225.4 lb

## 2017-09-04 DIAGNOSIS — F331 Major depressive disorder, recurrent, moderate: Secondary | ICD-10-CM | POA: Diagnosis not present

## 2017-09-04 DIAGNOSIS — F322 Major depressive disorder, single episode, severe without psychotic features: Secondary | ICD-10-CM

## 2017-09-04 DIAGNOSIS — F411 Generalized anxiety disorder: Secondary | ICD-10-CM | POA: Diagnosis not present

## 2017-09-04 DIAGNOSIS — F1120 Opioid dependence, uncomplicated: Secondary | ICD-10-CM | POA: Diagnosis not present

## 2017-09-04 MED ORDER — GABAPENTIN 300 MG PO CAPS: 300.0000 mg | ORAL_CAPSULE | Freq: Three times a day (TID) | ORAL | 1 refills | Status: DC

## 2017-09-04 MED ORDER — HYDROXYZINE PAMOATE 25 MG PO CAPS: 25.0000 mg | ORAL_CAPSULE | Freq: Four times a day (QID) | ORAL | 1 refills | Status: DC | PRN

## 2017-09-04 NOTE — Progress Notes (Signed)
   THERAPIST PROGRESS NOTE  Session Time: 45min  Participation Level: Active  Behavioral Response: CasualAlertDepressed  Type of Therapy: Individual Therapy  Treatment Goals addressed: Coping  Interventions: Solution Focused, Strength-based and Supportive  Summary: Barbette Reichmannonya M Shimizu is a 36 y.o. female who presents with continued symptoms of her diagnosis.  Patient was able to verbalize her current stressors with not working, being in pain and not having pain medication and limited support with her sons fathers.  She reports that she attempted [employer] but due to her child support she is unable to obtain assistance.  She reports that she wants to work on improving her coping skills and improving her financial situation.   Suicidal/Homicidal: No  Therapist Response: LCSW discussed what psychotherapy is and is not and the importance of the therapeutic relationship to include open and honest communication between client and therapist and building trust.  Reviewed advantages and disadvantages of the therapeutic process and limitations to the therapeutic relationship including LCSW's role in maintaining the safety of the client, others and those in client's care. Completed treatment plan.  Plan: Return again in 2 weeks.  Diagnosis: Axis I: Major Depression, Recurrent severe    Axis II: No diagnosis    Marinda Elkicole M Jadien Lehigh, LCSW 09/04/2017
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
## 2017-09-04 NOTE — Patient Instructions (Signed)
Gabapentin capsules or tablets What is this medicine? GABAPENTIN (GA ba pen tin) is used to control partial seizures in adults with epilepsy. It is also used to treat certain types of nerve pain. This medicine may be used for other purposes; ask your health care provider or pharmacist if you have questions. COMMON BRAND NAME(S): Active-PAC with Gabapentin, Gabarone, Neurontin What should I tell my health care provider before I take this medicine? They need to know if you have any of these conditions: -kidney disease -suicidal thoughts, plans, or attempt; a previous suicide attempt by you or a family member -an unusual or allergic reaction to gabapentin, other medicines, foods, dyes, or preservatives -pregnant or trying to get pregnant -breast-feeding How should I use this medicine? Take this medicine by mouth with a glass of water. Follow the directions on the prescription label. You can take it with or without food. If it upsets your stomach, take it with food.Take your medicine at regular intervals. Do not take it more often than directed. Do not stop taking except on your doctor's advice. If you are directed to break the 600 or 800 mg tablets in half as part of your dose, the extra half tablet should be used for the next dose. If you have not used the extra half tablet within 28 days, it should be thrown away. A special MedGuide will be given to you by the pharmacist with each prescription and refill. Be sure to read this information carefully each time. Talk to your pediatrician regarding the use of this medicine in children. Special care may be needed. Overdosage: If you think you have taken too much of this medicine contact a poison control center or emergency room at once. NOTE: This medicine is only for you. Do not share this medicine with others. What if I miss a dose? If you miss a dose, take it as soon as you can. If it is almost time for your next dose, take only that dose. Do not  take double or extra doses. What may interact with this medicine? Do not take this medicine with any of the following medications: -other gabapentin products This medicine may also interact with the following medications: -alcohol -antacids -antihistamines for allergy, cough and cold -certain medicines for anxiety or sleep -certain medicines for depression or psychotic disturbances -homatropine; hydrocodone -naproxen -narcotic medicines (opiates) for pain -phenothiazines like chlorpromazine, mesoridazine, prochlorperazine, thioridazine This list may not describe all possible interactions. Give your health care provider a list of all the medicines, herbs, non-prescription drugs, or dietary supplements you use. Also tell them if you smoke, drink alcohol, or use illegal drugs. Some items may interact with your medicine. What should I watch for while using this medicine? Visit your doctor or health care professional for regular checks on your progress. You may want to keep a record at home of how you feel your condition is responding to treatment. You may want to share this information with your doctor or health care professional at each visit. You should contact your doctor or health care professional if your seizures get worse or if you have any new types of seizures. Do not stop taking this medicine or any of your seizure medicines unless instructed by your doctor or health care professional. Stopping your medicine suddenly can increase your seizures or their severity. Wear a medical identification bracelet or chain if you are taking this medicine for seizures, and carry a card that lists all your medications. You may get drowsy, dizzy,   or have blurred vision. Do not drive, use machinery, or do anything that needs mental alertness until you know how this medicine affects you. To reduce dizzy or fainting spells, do not sit or stand up quickly, especially if you are an older patient. Alcohol can  increase drowsiness and dizziness. Avoid alcoholic drinks. Your mouth may get dry. Chewing sugarless gum or sucking hard candy, and drinking plenty of water will help. The use of this medicine may increase the chance of suicidal thoughts or actions. Pay special attention to how you are responding while on this medicine. Any worsening of mood, or thoughts of suicide or dying should be reported to your health care professional right away. Women who become pregnant while using this medicine may enroll in the [place] Antiepileptic Drug Pregnancy Registry by calling [phone]. This registry collects information about the safety of antiepileptic drug use during pregnancy. What side effects may I notice from receiving this medicine? Side effects that you should report to your doctor or health care professional as soon as possible: -allergic reactions like skin rash, itching or hives, swelling of the face, lips, or tongue -worsening of mood, thoughts or actions of suicide or dying Side effects that usually do not require medical attention (report to your doctor or health care professional if they continue or are bothersome): -constipation -difficulty walking or controlling muscle movements -dizziness -nausea -slurred speech -tiredness -tremors -weight gain This list may not describe all possible side effects. Call your doctor for medical advice about side effects. You may report side effects to FDA at 1-800-FDA-1088. Where should I keep my medicine? Keep out of reach of children. This medicine may cause accidental overdose and death if it taken by other adults, children, or pets. Mix any unused medicine with a substance like cat litter or coffee grounds. Then throw the medicine away in a sealed container like a sealed bag or a coffee can with a lid. Do not use the medicine after the expiration date. Store at room temperature between 15 and 30 degrees C (59 and 86 degrees F). NOTE: This  sheet is a summary. It may not cover all possible information. If you have questions about this medicine, talk to your doctor, pharmacist, or health care provider.  2018 Elsevier/Gold Standard (2013-12-18 15:26:50) Hydroxyzine capsules or tablets What is this medicine? HYDROXYZINE (hye DROX i zeen) is an antihistamine. This medicine is used to treat allergy symptoms. It is also used to treat anxiety and tension. This medicine can be used with other medicines to induce sleep before surgery. This medicine may be used for other purposes; ask your health care provider or pharmacist if you have questions. COMMON BRAND NAME(S): ANX, Atarax, Rezine, Vistaril What should I tell my health care provider before I take this medicine? They need to know if you have any of these conditions: -any chronic illness -difficulty passing urine -glaucoma -heart disease -kidney disease -liver disease -lung disease -an unusual or allergic reaction to hydroxyzine, cetirizine, other medicines, foods, dyes, or preservatives -pregnant or trying to get pregnant -breast-feeding How should I use this medicine? Take this medicine by mouth with a full glass of water. Follow the directions on the prescription label. You may take this medicine with food or on an empty stomach. Take your medicine at regular intervals. Do not take your medicine more often than directed. Talk to your pediatrician regarding the use of this medicine in children. Special care may be needed. While this drug may be prescribed for children  as young as 5 years of age for selected conditions, precautions do apply. Patients over 59 years old may have a stronger reaction and need a smaller dose. Overdosage: If you think you have taken too much of this medicine contact a poison control center or emergency room at once. NOTE: This medicine is only for you. Do not share this medicine with others. What if I miss a dose? If you miss a dose, take it as soon as  you can. If it is almost time for your next dose, take only that dose. Do not take double or extra doses. What may interact with this medicine? -alcohol -barbiturate medicines for sleep or seizures -medicines for colds, allergies -medicines for depression, anxiety, or emotional disturbances -medicines for pain -medicines for sleep -muscle relaxants This list may not describe all possible interactions. Give your health care provider a list of all the medicines, herbs, non-prescription drugs, or dietary supplements you use. Also tell them if you smoke, drink alcohol, or use illegal drugs. Some items may interact with your medicine. What should I watch for while using this medicine? Tell your doctor or health care professional if your symptoms do not improve. You may get drowsy or dizzy. Do not drive, use machinery, or do anything that needs mental alertness until you know how this medicine affects you. Do not stand or sit up quickly, especially if you are an older patient. This reduces the risk of dizzy or fainting spells. Alcohol may interfere with the effect of this medicine. Avoid alcoholic drinks. Your mouth may get dry. Chewing sugarless gum or sucking hard candy, and drinking plenty of water may help. Contact your doctor if the problem does not go away or is severe. This medicine may cause dry eyes and blurred vision. If you wear contact lenses you may feel some discomfort. Lubricating drops may help. See your eye doctor if the problem does not go away or is severe. If you are receiving skin tests for allergies, tell your doctor you are using this medicine. What side effects may I notice from receiving this medicine? Side effects that you should report to your doctor or health care professional as soon as possible: -fast or irregular heartbeat -difficulty passing urine -seizures -slurred speech or confusion -tremor Side effects that usually do not require medical attention (report to your  doctor or health care professional if they continue or are bothersome): -constipation -drowsiness -fatigue -headache -stomach upset This list may not describe all possible side effects. Call your doctor for medical advice about side effects. You may report side effects to FDA at 1-800-FDA-1088. Where should I keep my medicine? Keep out of the reach of children. Store at room temperature between 15 and 30 degrees C (59 and 86 degrees F). Keep container tightly closed. Throw away any unused medicine after the expiration date. NOTE: This sheet is a summary. It may not cover all possible information. If you have questions about this medicine, talk to your doctor, pharmacist, or health care provider.  2018 Elsevier/Gold Standard (2008-03-05 14:50:59)

## 2017-09-06 ENCOUNTER — Telehealth: Payer: Self-pay | Admitting: Family Medicine

## 2017-09-06 NOTE — Telephone Encounter (Signed)
Pt advised this has been faxed off for the third time yesterday, and [facility] and I have been in touch yesterday regarding the updates that needed to be done this PA. Pt advised that this has been corrected to my knowledge, and if any further changes will need to be made, [facility] will inform me.

## 2017-09-06 NOTE — Telephone Encounter (Signed)
Pt stated that she hasn't been able to get her traMADol (ULTRAM) 50 MG tablet b/c of the required PA. Pt is requesting Penny Burtonmily return her call to see if she can get an update about the PA or if she can get an Rx for 30 tablets instead of 60 tablets to last until the PA is approved or if that would mess up her PA if she got 30 instead of 60. Please advise. Thanks TNP

## 2017-09-13 ENCOUNTER — Telehealth: Payer: Self-pay | Admitting: Family Medicine

## 2017-09-13 NOTE — Telephone Encounter (Signed)
Patient states that [facility] still has not received prior authorization for her traMADol (ULTRAM) 50 MG tablet.  Please advise.

## 2017-09-13 NOTE — Telephone Encounter (Signed)
Penny Tucker, have you started a prior auth on this patient on this medication?

## 2017-09-16 NOTE — Telephone Encounter (Signed)
Per pt, [facility] was able to fill this rx after "rerunning" the PA. She has her medication.

## 2017-09-25 ENCOUNTER — Ambulatory Visit: Payer: Self-pay | Admitting: Family Medicine

## 2017-10-01 ENCOUNTER — Ambulatory Visit (INDEPENDENT_AMBULATORY_CARE_PROVIDER_SITE_OTHER): Payer: Medicaid Other | Admitting: Licensed Clinical Social Worker

## 2017-10-01 ENCOUNTER — Other Ambulatory Visit: Payer: Self-pay

## 2017-10-01 ENCOUNTER — Ambulatory Visit (INDEPENDENT_AMBULATORY_CARE_PROVIDER_SITE_OTHER): Payer: Medicaid Other | Admitting: Psychiatry

## 2017-10-01 ENCOUNTER — Encounter: Payer: Self-pay | Admitting: Psychiatry

## 2017-10-01 VITALS — BP 128/84 | HR 98 | Temp 99.5°F | Wt 234.0 lb

## 2017-10-01 DIAGNOSIS — F1121 Opioid dependence, in remission: Secondary | ICD-10-CM

## 2017-10-01 DIAGNOSIS — F322 Major depressive disorder, single episode, severe without psychotic features: Secondary | ICD-10-CM | POA: Diagnosis not present

## 2017-10-01 DIAGNOSIS — F330 Major depressive disorder, recurrent, mild: Secondary | ICD-10-CM

## 2017-10-01 DIAGNOSIS — F411 Generalized anxiety disorder: Secondary | ICD-10-CM

## 2017-10-01 MED ORDER — NALTREXONE HCL 50 MG PO TABS: 50.0000 mg | ORAL_TABLET | Freq: Every day | ORAL | 1 refills | Status: DC

## 2017-10-01 MED ORDER — GABAPENTIN 300 MG PO CAPS: 300.0000 mg | ORAL_CAPSULE | Freq: Three times a day (TID) | ORAL | 1 refills | Status: DC

## 2017-10-01 MED ORDER — HYDROXYZINE PAMOATE 25 MG PO CAPS: 25.0000 mg | ORAL_CAPSULE | Freq: Four times a day (QID) | ORAL | 1 refills | Status: AC | PRN

## 2017-10-01 MED ORDER — CITALOPRAM HYDROBROMIDE 40 MG PO TABS: 40.0000 mg | ORAL_TABLET | Freq: Every day | ORAL | 1 refills | Status: DC

## 2017-10-01 MED ORDER — RISPERIDONE 0.25 MG PO TABS: 0.2500 mg | ORAL_TABLET | Freq: Three times a day (TID) | ORAL | 1 refills | Status: DC

## 2017-10-01 NOTE — Progress Notes (Signed)
BH MD OP Progress Note  10/01/2017 11:48 AM Penny Tucker  MRN:  [id number]  Chief Complaint: ' I am still anxious.'  Chief Complaint    Follow-up; Medication Refill     HPI: Penny Tucker is a 36 year old Caucasian female, who lives in [place], is single, unemployed, has a history of depression, anxiety, opioid dependence as well as chronic pain who presented to the clinic for a follow-up visit.  She reports her symptoms are a little bit better.  She reports she does not feel depressed as she used to before.  She however continues to have anxiety symptoms.  She reports that at times she is more irritable , snaps at her son.  She also reports that she has been dealing with chronic pain.  She reports that she used to borrow opioid pain medications from a friend.  She reports she does not do it anymore.  The last time she did it was 3 weeks ago.  She reports that she has constant dreams about heroin and opioid and she wonders whether she can get on Suboxone.  She reports that she needs some kind of help with her craving at this time.  She does not want to go back to abusing it.  She reports her mother continues to be supportive.  She is trying to find a job and is hopeful that she will be able to soon.  She denies any suicidality or perceptual disturbances. Visit Diagnosis:    ICD-10-CM   1. MDD (major depressive disorder), recurrent episode, mild (HCC) F33.0 citalopram (CELEXA) 40 MG tablet    risperiDONE (RISPERDAL) 0.25 MG tablet  2. 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 GAD (generalized anxiety disorder) F41.1 hydrOXYzine (VISTARIL) 25 MG capsule    gabapentin (NEURONTIN) 300 MG capsule    Past Psychiatric History: She has been to a therapist in the past after the death of her father in 7.  She reports it was helpful.  She continues to see Ms. Nolon Rod for CBT at this time.  She denies history of suicide attempts.  She  reports a history of being on Cymbalta( crazy), Wellbutrin ( worked).  Past Medical History:  Past Medical History:  Diagnosis Date  . Anxiety   . H/O drug abuse   . Hep C w/o coma, chronic (HCC)   . Myopia of both eyes   . Preeclampsia   . Reactive airway disease   . Ruptured lumbar disc 2008  . Thyrotoxicosis     Past Surgical History:  Procedure Laterality Date  . CERVIX LESION DESTRUCTION  1998  . CESAREAN SECTION  2007, 2018   x2  . 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 INCISION AND DRAINAGE ABSCESS Right 10/02/2015   Procedure: INCISION AND DRAINAGE ABSCESS;  Surgeon: Christena Flake, MD;  Location: [facility] ORS;  Service: Orthopedics;  Laterality: Right;  . LAPAROSCOPIC CHOLECYSTECTOMY  2010    Family Psychiatric History: 2 of her maternal aunts-bipolar disorder, maternal grandmother-bipolar disorder, dad-alcoholic.  Family History:  Family History  Problem Relation Age of Onset  . Clotting disorder Mother        factor V leiden  . Deep vein thrombosis Mother   . Depression Mother   . Liver disease Father        cirrhosis, hep C  . Alcohol abuse Father   . Drug abuse Father   . Healthy Brother   .  Healthy Brother   . Breast cancer Maternal Grandmother   . Depression Maternal Grandmother   . Breast cancer Maternal Aunt 40  . Depression Maternal Aunt   . Bipolar disorder Maternal Aunt   . Schizophrenia Maternal Aunt   . Breast cancer Cousin 40  . Colon cancer Neg Hx     Social History: She lives in [place].  She is single.  She has an 35 year old and a 2816-month-old.  She has support from her mother.  She is currently unemployed, looking for jobs.  She completed her GED.  She was released from prison in June 2018-for charges of probation violation, larceny. Social History   Socioeconomic History  . Marital status: Single    Spouse name: None  . Number of children: 2  . Years of education: None  . Highest education level: GED or equivalent   Social Needs  . Financial resource strain: Not hard at all  . Food insecurity - worry: Never true  . Food insecurity - inability: Never true  . Transportation needs - medical: No  . Transportation needs - non-medical: No  Occupational History  . Occupation: unemployed  Tobacco Use  . Smoking status: Current Every Day Smoker    Packs/day: 0.50    Years: 20.00    Pack years: 10.00    Types: Cigarettes  . Smokeless tobacco: Never Used  . Tobacco comment: starting smoking at age 36  Substance and Sexual Activity  . Alcohol use: No  . Drug use: No  . Sexual activity: Not Currently    Partners: Male    Birth control/protection: Implant  Other Topics Concern  . None  Social History Narrative  . None    Allergies: No Known Allergies  Metabolic Disorder Labs: No results found for: HGBA1C, MPG No results found for: PROLACTIN No results found for: CHOL, TRIG, HDL, CHOLHDL, VLDL, LDLCALC Lab Results  Component Value Date   TSH 0.47 03/21/2017    Therapeutic Level Labs: No results found for: LITHIUM No results found for: VALPROATE No components found for:  CBMZ  Current Medications: Current Outpatient Medications  Medication Sig Dispense Refill  . albuterol (PROVENTIL) (2.5 MG/3ML) 0.083% nebulizer solution Take 3 mLs (2.5 mg total) by nebulization every 6 (six) hours as needed for wheezing or shortness of breath. 75 mL 1  . beclomethasone (QVAR) 80 MCG/ACT inhaler Inhale 2 puffs into the lungs 2 (two) times daily. 1 Inhaler 12  . citalopram (CELEXA) 40 MG tablet Take 1 tablet (40 mg total) by mouth daily. 30 tablet 1  . etonogestrel (NEXPLANON) 68 MG IMPL implant 1 each by Subdermal route once.    . gabapentin (NEURONTIN) 300 MG capsule Take 1 capsule (300 mg total) by mouth 3 (three) times daily. 90 capsule 1  . hydrOXYzine (VISTARIL) 25 MG capsule Take 1 capsule (25 mg total) by mouth every 6 (six) hours as needed for anxiety. 120 capsule 1  . ibuprofen (ADVIL,MOTRIN) 200  MG tablet Take 600 mg by mouth every 6 (six) hours as needed.    . traMADol (ULTRAM) 50 MG tablet Take 1 tablet (50 mg total) by mouth every 12 (twelve) hours as needed. 60 tablet 0  . albuterol (PROVENTIL HFA;VENTOLIN HFA) 108 (90 Base) MCG/ACT inhaler Inhale 2 puffs into the lungs every 6 (six) hours as needed for wheezing or shortness of breath. 1 Inhaler 0  . naltrexone (DEPADE) 50 MG tablet Take 1 tablet (50 mg total) by mouth daily. 30 tablet 1  . risperiDONE (  RISPERDAL) 0.25 MG tablet Take 1 tablet (0.25 mg total) by mouth 3 (three) times daily. 90 tablet 1   No current facility-administered medications for this visit.      Musculoskeletal: Strength & Muscle Tone: within normal limits Gait & Station: normal Patient leans: N/A  Psychiatric Specialty Exam: Review of Systems  Respiratory: Positive for cough (on treatment).   Psychiatric/Behavioral: The patient is nervous/anxious.   All other systems reviewed and are negative.   Blood pressure 128/84, pulse 98, temperature 99.5 F (37.5 C), temperature source Oral, weight 234 lb (106.1 kg).Body mass index is 35.58 kg/m.  General Appearance: Casual  Eye Contact:  Good  Speech:  Normal Rate  Volume:  Normal  Mood:  Anxious  Affect:  Congruent  Thought Process:  Goal Directed and Descriptions of Associations: Circumstantial  Orientation:  Full (Time, Place, and Person)  Thought Content: Rumination   Suicidal Thoughts:  No  Homicidal Thoughts:  No  Memory:  Immediate;   Fair Recent;   Fair Remote;   Fair  Judgement:  Fair  Insight:  Fair  Psychomotor Activity:  Normal  Concentration:  Concentration: Fair and Attention Span: Fair  Recall:  FiservFair  Fund of Knowledge: Fair  Language: Fair  Akathisia:  No  Handed:  Right  AIMS (if indicated):NA  Assets:  Communication Skills Desire for Improvement Resilience Social Support Talents/Skills  ADL's:  Intact  Cognition: WNL  Sleep:  Fair   Screenings: GAD-7     Office  [facility]  Total GAD-7 Score  15  20    PHQ2-9     [facility]  PHQ-2 Total Score  4  4  PHQ-9 Total Score  11  14       Assessment and Plan: Archie Pattenonya is a 36 year old Caucasian female who has a history of depression, anxiety, chronic pain, opioid use in early remission.  She currently presents for a follow-up visit.  She continues to struggle with some anxiety and mood lability.  She also has craving for opioids and wonders whether she can get on Suboxone.  She denies suicidality.  She continues to get CBT from Ms. Nolon RodNicole Peacock.  She is a good candidate for outpatient treatment at this time.  Plan For anxiety Celexa 40 mg p.o. daily Gabapentin 300 mg 3 times daily. Continue hydroxyzine 25 mg every 6 hours as needed. GAD 7 = 15  For depression Celexa 40 mg p.o. daily Add Risperidone 0.25 mg p.o. 3 times daily Continue CBT with Ms. Peacock PHQ-9=12  For opioid use disorder currently in early remission Continue CBT Start naltrexone 50 mg p.o. daily.  Discussed interaction between naltrexone as well as being on medications like opioids, tramadol.  Discussed the risk of precipitating withdrawal symptoms if she is on medications like that.  Provided medication education as well as provided handouts.  She reorts she has not been on opioid medications since the past 3 weeks or so.  Follow up in 4 weeks or sooner if needed.  This note was generated in part or whole with voice recognition software. Voice recognition is usually quite accurate but there are transcription errors that can and very often do occur. I apologize for any typographical errors that were not detected and corrected.    More than 50 % of the time was spent for psychoeducation and supportive  psychotherapy and care  coordination.        Jomarie Longs, MD 10/01/2017, 11:48 AM

## 2017-10-01 NOTE — Patient Instructions (Signed)
Naltrexone tablets What is this medicine? NALTREXONE (nal TREX one) helps you to remain free of your dependence on opiate drugs or alcohol. It blocks the 'high' that these substances can give you. This medicine is combined with counseling and support groups. This medicine may be used for other purposes; ask your health care provider or pharmacist if you have questions. COMMON BRAND NAME(S): Depade, ReVia What should I tell my health care provider before I take this medicine? They need to know if you have any of these conditions: -if you have used drugs or alcohol within 7 to 10 days -kidney disease -liver disease, including hepatitis -an unusual or allergic reaction to naltrexone, other medicines, foods, dyes, or preservatives -pregnant or trying to get pregnant -breast-feeding How should I use this medicine? Take this medicine by mouth with a full glass of water. Follow the directions on the prescription label. Do not take this medicine within 7 to 10 days of taking any opioid drugs. Take your medicine at regular intervals. Do not take your medicine more often than directed. Do not stop taking except on your doctor's advice. Talk to your pediatrician regarding the use of this medicine in children. Special care may be needed. Overdosage: If you think you have taken too much of this medicine contact a poison control center or emergency room at once. NOTE: This medicine is only for you. Do not share this medicine with others. What if I miss a dose? If you miss a dose and remember on the same day, take the missed dose. If you do not remember until the next day, ask your doctor or health care professional about rescheduling your doses. Do not take double or extra doses. What may interact with this medicine? Do not take this medicine with any of the following medications: -any prescription or street opioid drug like codiene, heroin, methadone This medicine may also interact with the following  medications: -disulfiram -thioridazine This list may not describe all possible interactions. Give your health care provider a list of all the medicines, herbs, non-prescription drugs, or dietary supplements you use. Also tell them if you smoke, drink alcohol, or use illegal drugs. Some items may interact with your medicine. What should I watch for while using this medicine? Your condition will be monitored carefully while you are receiving this medicine. Visit your doctor or health care professional regularly. For this medicine to be most effective you should attend any counseling or support groups that your doctor or health care professional recommends. Do not try to overcome the effects of the medicine by taking large amounts of narcotics or by drinking large amounts of alcohol. This can cause severe problems including death. Also, you may be more sensitive to lower doses of narcotics after you stop taking this medicine. If you are going to have surgery, tell your doctor or health care professional that you are taking this medicine. Do not treat yourself for coughs, colds, pain, or diarrhea. Ask your doctor or health care professional for advice. Some of the ingredients may interact with this medicine and cause side effects. Wear a medical ID bracelet or chain, and carry a card that describes your disease and details of your medicine and dosage times. You may get drowsy or dizzy. Do not drive, use machinery, or do anything that needs mental alertness until you know how this medicine affects you. Do not stand or sit up quickly, especially if you are an older patient. This reduces the risk of dizzy or fainting spells.   Alcohol may interfere with the effect of this medicine. Avoid alcoholic drinks. What side effects may I notice from receiving this medicine? Side effects that you should report to your doctor or health care professional as soon as possible: -allergic reactions like skin rash, itching or  hives, swelling of the face, lips, or tongue -breathing problems -changes in vision, hearing -confusion -dark urine -depressed mood -diarrhea -fast or irregular heart beat -hallucination, loss of contact with reality -light-colored stools -right upper belly pain -suicidal thoughts or other mood changes -unusually weak or tired -vomiting -yellowing of the eyes or skin Side effects that usually do not require medical attention (report to your doctor or health care professional if they continue or are bothersome): -aches, pains -change in sex drive or performance -feeling anxious -headache -loss of appetite, nausea -runny nose, sinus problems, sneezing -stomach pain -trouble sleeping This list may not describe all possible side effects. Call your doctor for medical advice about side effects. You may report side effects to FDA at 1-800-FDA-1088. Where should I keep my medicine? Keep out of the reach of children. Store at room temperature between 20 and 25 degrees C (68 and 77 degrees F). Throw away any unused medicine after the expiration date. NOTE: This sheet is a summary. It may not cover all possible information. If you have questions about this medicine, talk to your doctor, pharmacist, or health care provider.  2018 Elsevier/Gold Standard (2012-08-14 10:33:18) Risperidone tablets What is this medicine? RISPERIDONE (ris PER i done) is an antipsychotic. It is used to treat schizophrenia, bipolar disorder, and some symptoms of autism. This medicine may be used for other purposes; ask your health care provider or pharmacist if you have questions. COMMON BRAND NAME(S): Risperdal What should I tell my health care provider before I take this medicine? They need to know if you have any of these conditions: -blood disorder or disease -dementia -diabetes or a family history of diabetes -difficulty swallowing -heart disease or previous heart attack -history of brain tumor or head  injury -history of breast cancer -irregular heartbeat or low blood pressure -kidney or liver disease -Parkinson's disease -seizures (convulsions) -an unusual or allergic reaction to risperidone, paliperidone, other medicines, foods, dyes, or preservatives -pregnant or trying to get pregnant -breast-feeding How should I use this medicine? Take this medicine by mouth with a glass of water. Follow the directions on the prescription label. You can take it with or without food. If it upsets your stomach, take it with food. Take your medicine at regular intervals. Do not take it more often than directed. Do not stop taking except on your doctor's advice. Talk to your pediatrician regarding the use of this medicine in children. While this drug may be prescribed for children as young as 95 years of age for selected conditions, precautions do apply. Overdosage: If you think you have taken too much of this medicine contact a poison control center or emergency room at once. NOTE: This medicine is only for you. Do not share this medicine with others. What if I miss a dose? If you miss a dose, take it as soon as you can. If it is almost time for your next dose, take only that dose. Do not take double or extra doses. What may interact with this medicine? Do not take this medicine with any of the following medications: -certain medicines for fungal infections like fluconazole, itraconazole, ketoconazole, posaconazole, voriconazole -cisapride -dofetilide -dronedarone -droperidol -pimozide -sparfloxacin -thioridazine This medicine may also interact with the  following medications: -arsenic trioxide -certain antibiotics like clarithromycin, gatifloxacin, levofloxacin, moxifloxacin, pentamidine, rifampin -certain medicines for blood pressure -certain medicines for cancer -certain medicines for irregular heart beat -certain medications for Parkinson's disease like levodopa -certain medicines for seizures  like carbamazepine -certain medicines for sleep or sedation -narcotic medicines for pain -other medicines for mental anxiety, depression, or psychotic disturbances -other medicines that prolong the QT interval (cause an abnormal heart rhythm) -ritonavir This list may not describe all possible interactions. Give your health care provider a list of all the medicines, herbs, non-prescription drugs, or dietary supplements you use. Also tell them if you smoke, drink alcohol, or use illegal drugs. Some items may interact with your medicine. What should I watch for while using this medicine? Visit your doctor or health care professional for regular checks on your progress. It may be several weeks before you see the full effects. Do not suddenly stop taking this medicine. You may need to gradually reduce the dose. Only stop taking this medicine on the advice of your doctor or health care professional. Bonita QuinYou may get dizzy or drowsy. Do not drive, use machinery, or do anything that needs mental alertness until you know how this medicine affects you. Do not stand or sit up quickly, especially if you are an older patient. This reduces the risk of dizzy or fainting spells. Alcohol can increase dizziness and drowsiness. Avoid alcoholic drinks. You can get a hangover effect the morning after a bedtime dose. Do not treat yourself for colds, diarrhea or allergies. Ask your doctor or health care professional for advice, some nonprescription medicines may increase possible side effects. This medicine can reduce the response of your body to heat or cold. Dress warm in cold weather and stay hydrated in hot weather. If possible, avoid extreme temperatures like saunas, hot tubs, very hot or cold showers, or activities that can cause dehydration such as vigorous exercise. What side effects may I notice from receiving this medicine? Side effects that you should report to your doctor or health care professional as soon as  possible: -aching muscles and joints -confusion -fainting spells -fast or irregular heartbeat -fever or chills, sore throat -increased hunger or thirst -increased urination -loss of balance, difficulty walking or falls -stiffness, spasms, trembling -uncontrollable head, mouth, neck, arm, or leg movements -unusually weak or tired Side effects that usually do not require medical attention (report to your doctor or health care professional if they continue or are bothersome): -constipation -decreased sexual ability -difficulty sleeping -drowsiness or dizziness -increase or decrease in saliva -nausea, vomiting -weight gain This list may not describe all possible side effects. Call your doctor for medical advice about side effects. You may report side effects to FDA at 1-800-FDA-1088. Where should I keep my medicine? Keep out of the reach of children. Store at room temperature between 15 and 25 degrees C (59 and 77 degrees F). Protect from light. Throw away any unused medicine after the expiration date. NOTE: This sheet is a summary. It may not cover all possible information. If you have questions about this medicine, talk to your doctor, pharmacist, or health care provider.  2018 Elsevier/Gold Standard (2016-06-11 18:50:25)

## 2017-10-02 ENCOUNTER — Ambulatory Visit: Payer: Medicaid Other | Admitting: Family Medicine

## 2017-10-02 VITALS — BP 118/74 | HR 96 | Temp 98.3°F | Resp 16 | Wt 232.0 lb

## 2017-10-02 DIAGNOSIS — R058 Other specified cough: Secondary | ICD-10-CM | POA: Insufficient documentation

## 2017-10-02 DIAGNOSIS — F1911 Other psychoactive substance abuse, in remission: Secondary | ICD-10-CM

## 2017-10-02 DIAGNOSIS — Z720 Tobacco use: Secondary | ICD-10-CM | POA: Diagnosis not present

## 2017-10-02 DIAGNOSIS — M5126 Other intervertebral disc displacement, lumbar region: Secondary | ICD-10-CM | POA: Diagnosis not present

## 2017-10-02 DIAGNOSIS — R41840 Attention and concentration deficit: Secondary | ICD-10-CM

## 2017-10-02 DIAGNOSIS — R768 Other specified abnormal immunological findings in serum: Secondary | ICD-10-CM

## 2017-10-02 DIAGNOSIS — Z87898 Personal history of other specified conditions: Secondary | ICD-10-CM

## 2017-10-02 DIAGNOSIS — J4530 Mild persistent asthma, uncomplicated: Secondary | ICD-10-CM

## 2017-10-02 DIAGNOSIS — R05 Cough: Secondary | ICD-10-CM | POA: Diagnosis not present

## 2017-10-02 MED ORDER — TRAMADOL HCL 50 MG PO TABS: 50.0000 mg | ORAL_TABLET | Freq: Two times a day (BID) | ORAL | 0 refills | Status: DC | PRN

## 2017-10-02 NOTE — Assessment & Plan Note (Signed)
Suspect bronchitis related to smoking No wheezing to suggest asthma flare currently Get CXR to r/o CAP

## 2017-10-02 NOTE — Assessment & Plan Note (Signed)
3-5 minute discussion regarding negative effects of smoking, need to quit Patient not interested in cessation at this time and has increased her use since last visit

## 2017-10-02 NOTE — Assessment & Plan Note (Signed)
Avoid addictive controlled substances including benzos, narcotics, and Adderall given h/o IV drug abuse

## 2017-10-02 NOTE — Progress Notes (Signed)
Patient: Penny Tucker Female    DOB: 11/08/1980   36 y.o.   MRN: [id number] Visit Date: 10/02/2017  Today's Provider: Shirlee LatchAngela Ivanka Kirshner, MD   Chief Complaint  Patient presents with  . Reactive Airway Disease   Subjective:    HPI    Reactive Airway Disease: The patient has been previously diagnosed with RAD. Symptoms currently include dyspnea, productive cough and wheezing and has been present for 7 weeks.   Last OV was 08/26/2017. Pt was advised to continue Albuterol inhaler PRN, restart Qvar and she was given a one week course of Prednisone.  Does she do nebulizer treatments? No; she needs a nebulizer machine Does she use an inhaler? Yes - using albuterol 3-4 times daily (when feeling SOB) Does she use a spacer w/MDIs? No  Continues to have productive cough - worse at night and first thing in the morning Tried OTC cough syrup and cough drops Has increased tobacco use back to ~1 ppd  Back Pain Pt was referred to pain clinic, Dr. Cherylann RatelLateef at [facility]. She states she was denied this referral.  Psychologist Nolon Rod(Nicole Peacock) told her about a pain clinic in [place] that also does suboxone and would see her even with her h/o drug abuse.  Psych started Naltrexone - patient has not started as she does not think she has any cravings and that she needs to get more pain medication.  Wants refill on tramadol.  Years ago, was diagnosed with ADD and was taking adderral.  Having trouble focusing now and wants to take Adderal for this.  Does not think she has had formal ADHD testing in the past.  No Known Allergies   Current Outpatient Medications:  .  albuterol (PROVENTIL HFA;VENTOLIN HFA) 108 (90 Base) MCG/ACT inhaler, Inhale 2 puffs into the lungs every 6 (six) hours as needed for wheezing or shortness of breath., Disp: 1 Inhaler, Rfl: 0 .  beclomethasone (QVAR) 80 MCG/ACT inhaler, Inhale 2 puffs into the lungs 2 (two) times daily., Disp: 1 Inhaler, Rfl: 12 .  citalopram (CELEXA) 40 MG  tablet, Take 1 tablet (40 mg total) by mouth daily., Disp: 30 tablet, Rfl: 1 .  etonogestrel (NEXPLANON) 68 MG IMPL implant, 1 each by Subdermal route once., Disp: , Rfl:  .  gabapentin (NEURONTIN) 300 MG capsule, Take 1 capsule (300 mg total) by mouth 3 (three) times daily., Disp: 90 capsule, Rfl: 1 .  hydrOXYzine (VISTARIL) 25 MG capsule, Take 1 capsule (25 mg total) by mouth every 6 (six) hours as needed for anxiety., Disp: 120 capsule, Rfl: 1 .  traMADol (ULTRAM) 50 MG tablet, Take 1 tablet (50 mg total) by mouth every 12 (twelve) hours as needed., Disp: 30 tablet, Rfl: 0 .  albuterol (PROVENTIL) (2.5 MG/3ML) 0.083% nebulizer solution, Take 3 mLs (2.5 mg total) by nebulization every 6 (six) hours as needed for wheezing or shortness of breath. (Patient not taking: Reported on 10/02/2017), Disp: 75 mL, Rfl: 1 .  ibuprofen (ADVIL,MOTRIN) 200 MG tablet, Take 600 mg by mouth every 6 (six) hours as needed., Disp: , Rfl:  .  naltrexone (DEPADE) 50 MG tablet, Take 1 tablet (50 mg total) by mouth daily. (Patient not taking: Reported on 10/02/2017), Disp: 30 tablet, Rfl: 1 .  risperiDONE (RISPERDAL) 0.25 MG tablet, Take 1 tablet (0.25 mg total) by mouth 3 (three) times daily. (Patient not taking: Reported on 10/02/2017), Disp: 90 tablet, Rfl: 1  Review of Systems  Constitutional: Positive for diaphoresis and unexpected  weight change. Negative for activity change, appetite change, chills, fatigue and fever.  Respiratory: Positive for cough, shortness of breath and wheezing. Negative for chest tightness.   Cardiovascular: Negative for chest pain, palpitations and leg swelling.  Musculoskeletal: Positive for back pain.    Social History   Tobacco Use  . Smoking status: Current Every Day Smoker    Packs/day: 0.50    Years: 20.00    Pack years: 10.00    Types: Cigarettes  . Smokeless tobacco: Never Used  . Tobacco comment: starting smoking at age 47  Substance Use Topics  . Alcohol use: No    Objective:   BP 118/74 (BP Location: Left Arm, Patient Position: Sitting, Cuff Size: Large)   Pulse 96   Temp 98.3 F (36.8 C) (Oral)   Resp 16   Wt 232 lb (105.2 kg)   SpO2 97%   BMI 35.28 kg/m  Vitals:   10/02/17 0810  BP: 118/74  Pulse: 96  Resp: 16  Temp: 98.3 F (36.8 C)  TempSrc: Oral  SpO2: 97%  Weight: 232 lb (105.2 kg)     Physical Exam  Constitutional: She is oriented to person, place, and time. She appears well-developed and well-nourished. No distress.  HENT:  Head: Normocephalic and atraumatic.  Right Ear: External ear normal.  Left Ear: External ear normal.  Nose: Nose normal.  Mouth/Throat: Oropharynx is clear and moist.  Eyes: Conjunctivae are normal. No scleral icterus.  Neck: Neck supple. No thyromegaly present.  Cardiovascular: Normal rate, regular rhythm, normal heart sounds and intact distal pulses.  No murmur heard. Pulmonary/Chest: Effort normal and breath sounds normal. No respiratory distress. She has no wheezes. She has no rales.  Musculoskeletal: She exhibits no edema or deformity.  Diffuse TTP over neck and back  Lymphadenopathy:    She has no cervical adenopathy.  Neurological: She is alert and oriented to person, place, and time.  Skin: Skin is warm and dry. No rash noted.  Psychiatric: She has a normal mood and affect. Her behavior is normal.  Vitals reviewed.       Assessment & Plan:      Problem List Items Addressed This Visit      Respiratory   Reactive airway disease - Primary    Resolution of acute flair Fairly well controlled on QVar, but continues to have cough Patient is coughing today, but lungs are clear today, no wheezing Discussed effects of smoking and had 3-5 minute discussion regarding smoking and importance of cessation         Musculoskeletal and Integument   Lumbar disc herniation    Patient with chronic pain throughout neck and back Discussed again that opioid therapy is not appropriate given h/o  drug abuse Has been denied by 2-3 different pain clinics She was told by Psych that [facility] will see her with her drug abuse history 1 refill of tramadol given and patient knows this will be the last refill      Relevant Orders   Ambulatory referral to Pain Clinic     Other   H/O drug abuse    Avoid addictive controlled substances including benzos, narcotics, and Adderall given h/o IV drug abuse      Relevant Orders   Ambulatory referral to Pain Clinic   Tobacco abuse    3-5 minute discussion regarding negative effects of smoking, need to quit Patient not interested in cessation at this time and has increased her use since last visit  Hepatitis C antibody test positive    Patient believes that she has Hep C and was supposed to get treatment before going to prison [facility] that [facility] was positive but RNA was negative Will recheck Ab with reflex RNA      Relevant Orders   Comprehensive metabolic panel   Hepatitis C Antibody   Productive cough    Suspect bronchitis related to smoking No wheezing to suggest asthma flare currently Get CXR to r/o CAP      Relevant Orders   DG Chest 2 View   CBC (Completed)   Poor concentration    Discussed with patient that adderral is another controlled substance that we should avoid in the setting of h/o IV drug abuse Will refer to neuropsych for ADD testing Advised her to speak to psych about this, and she states that when she did she was started on risperdal Advised her that if this was prescribed by psych, she should take it as prescribed      Relevant Orders   Ambulatory referral to Neuropsychology      Return in about 3 months (around 01/02/2018) for asthma, smoking.      The entirety of the information documented in the History of Present Illness, Review of Systems and Physical Exam were personally obtained by me. Portions of this information were initially documented by Irving BurtonEmily Ratchford, CMA and reviewed  by me for thoroughness and accuracy.     Shirlee LatchAngela Purcell Jungbluth, MD  [facility]

## 2017-10-02 NOTE — Assessment & Plan Note (Signed)
Patient with chronic pain throughout neck and back Discussed again that opioid therapy is not appropriate given h/o drug abuse Has been denied by 2-3 different pain clinics She was told by Psych that [facility] will see her with her drug abuse history 1 refill of tramadol given and patient knows this will be the last refill

## 2017-10-02 NOTE — Assessment & Plan Note (Signed)
Resolution of acute flair Fairly well controlled on QVar, but continues to have cough Patient is coughing today, but lungs are clear today, no wheezing Discussed effects of smoking and had 3-5 minute discussion regarding smoking and importance of cessation

## 2017-10-02 NOTE — Patient Instructions (Signed)
Cough, Adult Coughing is a reflex that clears your throat and your airways. Coughing helps to heal and protect your lungs. It is normal to cough occasionally, but a cough that happens with other symptoms or lasts a long time may be a sign of a condition that needs treatment. A cough may last only 2-3 weeks (acute), or it may last longer than 8 weeks (chronic). What are the causes? Coughing is commonly caused by:  Breathing in substances that irritate your lungs.  A viral or bacterial respiratory infection.  Allergies.  Asthma.  Postnasal drip.  Smoking.  Acid backing up from the stomach into the esophagus (gastroesophageal reflux).  Certain medicines.  Chronic lung problems, including COPD (or rarely, lung cancer).  Other medical conditions such as heart failure.  Follow these instructions at home: Pay attention to any changes in your symptoms. Take these actions to help with your discomfort:  Take medicines only as told by your health care provider. ? If you were prescribed an antibiotic medicine, take it as told by your health care provider. Do not stop taking the antibiotic even if you start to feel better. ? Talk with your health care provider before you take a cough suppressant medicine.  Drink enough fluid to keep your urine clear or pale yellow.  If the air is dry, use a cold steam vaporizer or humidifier in your bedroom or your home to help loosen secretions.  Avoid anything that causes you to cough at work or at home.  If your cough is worse at night, try sleeping in a semi-upright position.  Avoid cigarette smoke. If you smoke, quit smoking. If you need help quitting, ask your health care provider.  Avoid caffeine.  Avoid alcohol.  Rest as needed.  Contact a health care provider if:  You have new symptoms.  You cough up pus.  Your cough does not get better after 2-3 weeks, or your cough gets worse.  You cannot control your cough with suppressant  medicines and you are losing sleep.  You develop pain that is getting worse or pain that is not controlled with pain medicines.  You have a fever.  You have unexplained weight loss.  You have night sweats. Get help right away if:  You cough up blood.  You have difficulty breathing.  Your heartbeat is very fast. This information is not intended to replace advice given to you by your health care provider. Make sure you discuss any questions you have with your health care provider. Document Released: 04/20/2011 Document Revised: 03/29/2016 Document Reviewed: 12/29/2014 Elsevier Interactive Patient Education  2017 Elsevier Inc.  

## 2017-10-02 NOTE — Assessment & Plan Note (Signed)
Discussed with patient that adderral is another controlled substance that we should avoid in the setting of h/o IV drug abuse Will refer to neuropsych for ADD testing Advised her to speak to psych about this, and she states that when she did she was started on risperdal Advised her that if this was prescribed by psych, she should take it as prescribed

## 2017-10-02 NOTE — Assessment & Plan Note (Signed)
Patient believes that she has Hep C and was supposed to get treatment [facility] but [facility] negative Will recheck Ab with reflex RNA

## 2017-10-03 NOTE — Progress Notes (Signed)
Advised  ED 

## 2017-10-04 NOTE — Progress Notes (Signed)
   THERAPIST PROGRESS NOTE  Session Time: 60min  Participation Level: Active  Behavioral Response: CasualAlertDepressed  Type of Therapy: Individual Therapy  Treatment Goals addressed: Coping and Diagnosis: Depression  Interventions: CBT, Motivational Interviewing and Solution Focused  Summary: Barbette Reichmannonya M Don is a 36 y.o. female who presents with frustration.  Patient reports that she has been in pain the last few weeks.  She reports that she has experienced back problems for the past few years but she is unable to get pain medication due to her previous Opiate addiction.  She states "no one wants to take a chance on me."  Patient reports that she spent Thanksgiving with her infant son due to her family not trusting her or wanting her around.  She reports that she feels bad that she is not trusted by her family but she honors their wishes by staying home.  SHe was able to list behaviors/things that she can do to continue to develop and build trust.     Suicidal/Homicidal: No  Therapist Response: LCSW provided Patient with ongoing emotional support and encouragement.  Normalized her feelings.  Commended Patient on her progress and reinforced the importance of client staying focused on her own strengths and resources and resiliency. Processed various strategies for dealing with stressors.    Plan: Return again in 2 weeks.  Diagnosis: Axis I: Major Depression, Recurrent severe    Axis II: No diagnosis    Marinda Elkicole M Peacock, LCSW 10/04/2017

## 2017-10-07 ENCOUNTER — Other Ambulatory Visit: Payer: Self-pay | Admitting: Family Medicine

## 2017-10-07 ENCOUNTER — Telehealth: Payer: Self-pay

## 2017-10-07 DIAGNOSIS — B1920 Unspecified viral hepatitis C without hepatic coma: Secondary | ICD-10-CM | POA: Insufficient documentation

## 2017-10-07 LAB — COMPLETE METABOLIC PANEL WITH GFR
AG RATIO: 1.4 (calc) (ref 1.0–2.5)
ALT: 43 U/L — AB (ref 6–29)
AST: 38 U/L — ABNORMAL HIGH (ref 10–30)
Albumin: 3.9 g/dL (ref 3.6–5.1)
Alkaline phosphatase (APISO): 84 U/L (ref 33–115)
BUN/Creatinine Ratio: 7 (calc) (ref 6–22)
BUN: 5 mg/dL — AB (ref 7–25)
CALCIUM: 9.3 mg/dL (ref 8.6–10.2)
CO2: 28 mmol/L (ref 20–32)
CREATININE: 0.7 mg/dL (ref 0.50–1.10)
Chloride: 105 mmol/L (ref 98–110)
GFR, EST AFRICAN AMERICAN: 130 mL/min/{1.73_m2} (ref >=60)
GFR, EST NON AFRICAN AMERICAN: 112 mL/min/{1.73_m2} (ref >=60)
Globulin: 2.8 g/dL (calc) (ref 1.9–3.7)
Glucose, Bld: 57 mg/dL — ABNORMAL LOW (ref 65–139)
Potassium: 4.4 mmol/L (ref 3.5–5.3)
Sodium: 138 mmol/L (ref 135–146)
TOTAL PROTEIN: 6.7 g/dL (ref 6.1–8.1)
Total Bilirubin: 0.9 mg/dL (ref 0.2–1.2)

## 2017-10-07 LAB — CBC
HCT: 41.9 % (ref 35.0–45.0)
Hemoglobin: 14.7 g/dL (ref 11.7–15.5)
MCH: 30.8 pg (ref 27.0–33.0)
MCHC: 35.1 g/dL (ref 32.0–36.0)
MCV: 87.7 fL (ref 80.0–100.0)
MPV: 8.8 fL (ref 7.5–12.5)
PLATELETS: 399 10*3/uL (ref 140–400)
RBC: 4.78 10*6/uL (ref 3.80–5.10)
RDW: 12.5 % (ref 11.0–15.0)
WBC: 9.1 10*3/uL (ref 3.8–10.8)

## 2017-10-07 LAB — HEPATITIS C ANTIBODY
HEP C AB: REACTIVE — AB
SIGNAL TO CUT-OFF: 14.1 — AB (ref <1.00)

## 2017-10-07 LAB — HCV RNA,QUANTITATIVE REAL TIME PCR
HCV QUANT LOG: DETECTED {Log_IU}/mL — AB
HCV RNA, PCR, QN: DETECTED [IU]/mL — AB

## 2017-10-07 NOTE — Telephone Encounter (Signed)
Pt advised, and agrees with treatment plan. 

## 2017-10-07 NOTE — Telephone Encounter (Signed)
Pt is returning call.  ZO#[phone]/[id number]#[phone]/MW

## 2017-10-07 NOTE — Telephone Encounter (Signed)
lmtcb

## 2017-10-07 NOTE — Telephone Encounter (Signed)
-----   Message from Erasmo DownerAngela M Bacigalupo, MD sent at 10/07/2017  9:24 AM EST ----- Confirmatory Hep C test is also positive (even though it was negative in the past).  We will refer to GI for further eval.  Bacigalupo, Marzella SchleinAngela M, MD, MPH [facility] 10/07/2017 9:24 AM

## 2017-10-10 ENCOUNTER — Other Ambulatory Visit: Payer: Self-pay | Admitting: Psychiatry

## 2017-10-10 ENCOUNTER — Telehealth: Payer: Self-pay

## 2017-10-10 DIAGNOSIS — F330 Major depressive disorder, recurrent, mild: Secondary | ICD-10-CM

## 2017-10-10 MED ORDER — RISPERIDONE 0.25 MG PO TABS: 0.2500 mg | ORAL_TABLET | Freq: Two times a day (BID) | ORAL | 1 refills | Status: DC

## 2017-10-10 NOTE — Telephone Encounter (Signed)
received a fax stating that insurance will not cover the risperidone 0.25mg  3 times a daily.  it exceeds adult recommended dosages per insurance. Please send in a new rx dosage.

## 2017-10-10 NOTE — Telephone Encounter (Signed)
RISPERIDONE changed to 0.25 mg po bid for mood sx.

## 2017-10-10 NOTE — Telephone Encounter (Signed)
Sent new script - Risperidone 0.25 mg po bid to [facility] [place]. Please let pt know.

## 2017-10-17 ENCOUNTER — Other Ambulatory Visit: Payer: Self-pay | Admitting: Neurosurgery

## 2017-10-17 DIAGNOSIS — M5412 Radiculopathy, cervical region: Secondary | ICD-10-CM

## 2017-10-30 ENCOUNTER — Encounter: Payer: Self-pay | Admitting: Gastroenterology

## 2017-10-30 ENCOUNTER — Ambulatory Visit: Payer: Medicaid Other | Admitting: Gastroenterology

## 2017-11-01 ENCOUNTER — Ambulatory Visit: Payer: Medicaid Other | Admitting: Licensed Clinical Social Worker

## 2017-11-01 ENCOUNTER — Ambulatory Visit: Payer: Medicaid Other | Admitting: Psychiatry

## 2017-11-04 ENCOUNTER — Other Ambulatory Visit: Payer: Medicaid Other

## 2017-11-19 ENCOUNTER — Telehealth: Payer: Self-pay | Admitting: Family Medicine

## 2017-11-19 NOTE — Telephone Encounter (Signed)
FYI--[facility] but will not return calls to their office to get appointment set up.I also made attempt to contact pt on 11/12/17.She has not returned call

## 2017-11-20 ENCOUNTER — Ambulatory Visit: Payer: Self-pay | Admitting: Family Medicine

## 2017-11-27 ENCOUNTER — Ambulatory Visit: Payer: Self-pay | Admitting: Family Medicine

## 2017-11-28 ENCOUNTER — Ambulatory Visit: Payer: Self-pay | Admitting: Family Medicine

## 2017-11-29 ENCOUNTER — Ambulatory Visit: Payer: Self-pay | Admitting: Family Medicine

## 2017-12-02 ENCOUNTER — Encounter: Payer: Self-pay | Admitting: Family Medicine

## 2017-12-04 ENCOUNTER — Ambulatory Visit: Payer: Medicaid Other | Admitting: Gastroenterology

## 2017-12-04 ENCOUNTER — Encounter: Payer: Self-pay | Admitting: Gastroenterology

## 2017-12-06 ENCOUNTER — Encounter: Payer: Self-pay | Admitting: Family Medicine

## 2017-12-06 ENCOUNTER — Ambulatory Visit: Payer: Medicaid Other | Admitting: Family Medicine

## 2017-12-06 VITALS — BP 116/74 | HR 95 | Temp 98.1°F | Resp 16 | Wt 238.0 lb

## 2017-12-06 DIAGNOSIS — J4541 Moderate persistent asthma with (acute) exacerbation: Secondary | ICD-10-CM | POA: Diagnosis not present

## 2017-12-06 DIAGNOSIS — R1031 Right lower quadrant pain: Secondary | ICD-10-CM

## 2017-12-06 MED ORDER — PREDNISONE 10 MG (21) PO TBPK
ORAL_TABLET | ORAL | 0 refills | Status: DC
Start: 2017-12-06 — End: 2018-01-01

## 2017-12-06 NOTE — Patient Instructions (Signed)
Asthma, Acute Bronchospasm °Acute bronchospasm caused by asthma is also referred to as an asthma attack. Bronchospasm means your air passages become narrowed. The narrowing is caused by inflammation and tightening of the muscles in the air tubes (bronchi) in your lungs. This can make it hard to breathe or cause you to wheeze and cough. °What are the causes? °Possible triggers are: °· Animal dander from the skin, hair, or feathers of animals. °· Dust mites contained in house dust. °· Cockroaches. °· Pollen from trees or grass. °· Mold. °· Cigarette or tobacco smoke. °· Air pollutants such as dust, household cleaners, hair sprays, aerosol sprays, paint fumes, strong chemicals, or strong odors. °· Cold air or weather changes. Cold air may trigger inflammation. Winds increase molds and pollens in the air. °· Strong emotions such as crying or laughing hard. °· Stress. °· Certain medicines such as aspirin or beta-blockers. °· Sulfites in foods and drinks, such as dried fruits and wine. °· Infections or inflammatory conditions, such as a flu, cold, or inflammation of the nasal membranes (rhinitis). °· Gastroesophageal reflux disease (GERD). GERD is a condition where stomach acid backs up into your esophagus. °· Exercise or strenuous activity. ° °What are the signs or symptoms? °· Wheezing. °· Excessive coughing, particularly at night. °· Chest tightness. °· Shortness of breath. °How is this diagnosed? °Your health care provider will ask you about your medical history and perform a physical exam. A chest X-ray or blood testing may be performed to look for other causes of your symptoms or other conditions that may have triggered your asthma attack. °How is this treated? °Treatment is aimed at reducing inflammation and opening up the airways in your lungs. Most asthma attacks are treated with inhaled medicines. These include quick relief or rescue medicines (such as bronchodilators) and controller medicines (such as inhaled  corticosteroids). These medicines are sometimes given through an inhaler or a nebulizer. Systemic steroid medicine taken by mouth or given through an IV tube also can be used to reduce the inflammation when an attack is moderate or severe. Antibiotic medicines are only used if a bacterial infection is present. °Follow these instructions at home: °· Rest. °· Drink plenty of liquids. This helps the mucus to remain thin and be easily coughed up. Only use caffeine in moderation and do not use alcohol until you have recovered from your illness. °· Do not smoke. Avoid being exposed to secondhand smoke. °· You play a critical role in keeping yourself in good health. Avoid exposure to things that cause you to wheeze or to have breathing problems. °· Keep your medicines up-to-date and available. Carefully follow your health care provider’s treatment plan. °· Take your medicine exactly as prescribed. °· When pollen or pollution is bad, keep windows closed and use an air conditioner or go to places with air conditioning. °· Asthma requires careful medical care. See your health care provider for a follow-up as advised. If you are more than [redacted] weeks pregnant and you were prescribed any new medicines, let your obstetrician know about the visit and how you are doing. Follow up with your health care provider as directed. °· After you have recovered from your asthma attack, make an appointment with your outpatient doctor to talk about ways to reduce the likelihood of future attacks. If you do not have a doctor who manages your asthma, make an appointment with a primary care doctor to discuss your asthma. °Get help right away if: °· You are getting worse. °·   You have trouble breathing. If severe, call your local emergency services (911 in the [place] °· You develop chest pain or discomfort. °· You are vomiting. °· You are not able to keep fluids down. °· You are coughing up yellow, green, brown, or bloody sputum. °· You have a fever  and your symptoms suddenly get worse. °· You have trouble swallowing. °This information is not intended to replace advice given to you by your health care provider. Make sure you discuss any questions you have with your health care provider. °Document Released: 02/06/2007 Document Revised: 04/04/2016 Document Reviewed: 04/29/2013 °Elsevier Interactive Patient Education © 2017 Elsevier Inc. ° °

## 2017-12-06 NOTE — Progress Notes (Signed)
Patient: Penny Tucker Female    DOB: 1981-07-06   37 y.o.   MRN: [id number] Visit Date: 12/06/2017  Today's Provider: Shirlee LatchAngela Bacigalupo, MD   I, Joslyn HyEmily Ratchford, CMA, am acting as scribe for Shirlee LatchAngela Bacigalupo, MD.  Chief Complaint  Patient presents with  . URI   Subjective:    URI   This is a recurrent problem. Episode onset: x 1 month. The problem has been gradually worsening. The maximum temperature recorded prior to her arrival was 101 - 101.9 F. Associated symptoms include abdominal pain (right sided groin pain. Constant "throbbing pressure", and will sometimes feel like a "contraction"), chest pain (tightness), congestion, coughing, diarrhea, dysuria, ear pain (left), headaches, nausea, a plugged ear sensation (left; has improved), a sore throat and wheezing. Pertinent negatives include no rhinorrhea, sinus pain, sneezing, swollen glands or vomiting. Associated symptoms comments: Pt is also c/o excessive sweating. Treatments tried: Mucinex, OTC cough syrup, cough drops, Flonase. The treatment provided no relief.    RLQ pain - constant - started this morning - hurts to move at all - no change in bowels, no vomiting - nothing seems to help - states she has never had pain this bad before    No Known Allergies   Current Outpatient Medications:  .  albuterol (PROVENTIL HFA;VENTOLIN HFA) 108 (90 Base) MCG/ACT inhaler, Inhale 2 puffs into the lungs every 6 (six) hours as needed for wheezing or shortness of breath., Disp: 1 Inhaler, Rfl: 0 .  beclomethasone (QVAR) 80 MCG/ACT inhaler, Inhale 2 puffs into the lungs 2 (two) times daily., Disp: 1 Inhaler, Rfl: 12 .  citalopram (CELEXA) 40 MG tablet, Take 1 tablet (40 mg total) by mouth daily., Disp: 30 tablet, Rfl: 1 .  etonogestrel (NEXPLANON) 68 MG IMPL implant, 1 each by Subdermal route once., Disp: , Rfl:  .  hydrOXYzine (VISTARIL) 25 MG capsule, Take 1 capsule (25 mg total) by mouth every 6 (six) hours as needed for  anxiety., Disp: 120 capsule, Rfl: 1 .  ibuprofen (ADVIL,MOTRIN) 200 MG tablet, Take 600 mg by mouth every 6 (six) hours as needed., Disp: , Rfl:  .  SUBOXONE 2-0.5 MG FILM, DISSOVLE ONE FILM UNDER THE TONGUE EVERY 12 HOURS, Disp: , Rfl: 0 .  albuterol (PROVENTIL) (2.5 MG/3ML) 0.083% nebulizer solution, Take 3 mLs (2.5 mg total) by nebulization every 6 (six) hours as needed for wheezing or shortness of breath. (Patient not taking: Reported on 10/02/2017), Disp: 75 mL, Rfl: 1  Review of Systems  HENT: Positive for congestion, ear pain (left) and sore throat. Negative for rhinorrhea, sinus pain and sneezing.   Respiratory: Positive for cough and wheezing.   Cardiovascular: Positive for chest pain (tightness).  Gastrointestinal: Positive for abdominal pain (right sided groin pain. Constant "throbbing pressure", and will sometimes feel like a "contraction"), diarrhea and nausea. Negative for vomiting.  Genitourinary: Positive for dysuria.  Neurological: Positive for headaches.    Social History   Tobacco Use  . Smoking status: Current Every Day Smoker    Packs/day: 0.75    Years: 20.00    Pack years: 15.00    Types: Cigarettes  . Smokeless tobacco: Never Used  . Tobacco comment: starting smoking at age 37  Substance Use Topics  . Alcohol use: No   Objective:   BP 116/74 (BP Location: Left Arm, Patient Position: Sitting, Cuff Size: Large)   Pulse 95   Temp 98.1 F (36.7 C) (Oral)   Resp 16   Wt  238 lb (108 kg)   SpO2 99%   BMI 36.19 kg/m  Vitals:   12/06/17 1400  BP: 116/74  Pulse: 95  Resp: 16  Temp: 98.1 F (36.7 C)  TempSrc: Oral  SpO2: 99%  Weight: 238 lb (108 kg)     Physical Exam  Constitutional: She is oriented to person, place, and time. She appears well-developed and well-nourished. No distress.  HENT:  Head: Normocephalic and atraumatic.  Right Ear: Tympanic membrane, external ear and ear canal normal.  Left Ear: Tympanic membrane, external ear and ear canal  normal.  Nose: Nose normal. Right sinus exhibits no maxillary sinus tenderness and no frontal sinus tenderness. Left sinus exhibits no maxillary sinus tenderness and no frontal sinus tenderness.  Mouth/Throat: Mucous membranes are normal. Posterior oropharyngeal erythema present. No oropharyngeal exudate or posterior oropharyngeal edema.  Eyes: Conjunctivae and EOM are normal. Pupils are equal, round, and reactive to light. No scleral icterus.  Neck: Neck supple. No thyromegaly present.  Cardiovascular: Normal rate, regular rhythm, normal heart sounds and intact distal pulses.  No murmur heard. Pulmonary/Chest: Effort normal. No respiratory distress. She has wheezes.  Hoarse coughing Coarse rhonchi  Abdominal: Soft. Bowel sounds are normal. She exhibits no distension. There is tenderness (TTP throughout the RLQ). There is guarding (slight). There is no rebound.  Musculoskeletal: She exhibits no edema.  Lymphadenopathy:    She has no cervical adenopathy.  Neurological: She is alert and oriented to person, place, and time.  Skin: Skin is warm and dry. No rash noted.  Psychiatric: She has a normal mood and affect. Her behavior is normal.  Vitals reviewed.       Assessment & Plan:   1. Moderate persistent reactive airway disease with acute exacerbation - will treat with prednsione taper - patient states taper works better than burst - no indication for XRay or antibiotics at this time - discussed symptomatic management with cough medications, mucinex, etc - return precautions discussed  2. RLQ abdominal pain - differential is broad and includes appendicitis (not currently febrile, but has been at home), ovarian cyst/torsion, constipaiton - as this is the "worst pain" of patient's life, needs imaging - advised to be seen in ED for further eval, imaging, and management    Meds ordered this encounter  Medications  . predniSONE (STERAPRED UNI-PAK 21 TAB) 10 MG (21) TBPK tablet     Sig: Take 60mg  x1d, 50mg  x1d, 40mg  x1d, 30mg  x1d, 20mg  x1d, 10mg  x1d, then stop    Dispense:  21 tablet    Refill:  0     No Follow-up on file.  Discussed patient's recent dismissal.  She states that she knows that she has no showed many times.  Discussed policy to see her for 30 days for acute problems only   The entirety of the information documented in the History of Present Illness, Review of Systems and Physical Exam were personally obtained by me. Portions of this information were initially documented by Irving Burton Ratchford, CMA and reviewed by me for thoroughness and accuracy.    Erasmo Downer, MD, MPH [facility] 12/06/2017 2:24 PM

## 2018-01-01 ENCOUNTER — Other Ambulatory Visit: Payer: Self-pay

## 2018-01-01 ENCOUNTER — Emergency Department
Admission: EM | Admit: 2018-01-01 | Discharge: 2018-01-01 | Disposition: A | Discharge status: Home / Self Care | Payer: Medicaid Other | Attending: Student in an Organized Health Care Education/Training Program | Admitting: Student in an Organized Health Care Education/Training Program

## 2018-01-01 ENCOUNTER — Emergency Department: Payer: Medicaid Other

## 2018-01-01 DIAGNOSIS — J45909 Unspecified asthma, uncomplicated: Secondary | ICD-10-CM | POA: Insufficient documentation

## 2018-01-01 DIAGNOSIS — J040 Acute laryngitis: Secondary | ICD-10-CM | POA: Diagnosis not present

## 2018-01-01 DIAGNOSIS — Z79899 Other long term (current) drug therapy: Secondary | ICD-10-CM | POA: Insufficient documentation

## 2018-01-01 DIAGNOSIS — R05 Cough: Secondary | ICD-10-CM | POA: Diagnosis present

## 2018-01-01 DIAGNOSIS — F17210 Nicotine dependence, cigarettes, uncomplicated: Secondary | ICD-10-CM | POA: Diagnosis not present

## 2018-01-01 HISTORY — DX: Unspecified asthma, uncomplicated: J45.909

## 2018-01-01 MED ORDER — CEFDINIR 300 MG PO CAPS: 300.0000 mg | ORAL_CAPSULE | Freq: Two times a day (BID) | ORAL | 0 refills | Status: DC

## 2018-01-01 MED ORDER — BECLOMETHASONE DIPROPIONATE 80 MCG/ACT IN AERS: 2.0000 | INHALATION_SPRAY | Freq: Two times a day (BID) | RESPIRATORY_TRACT | 12 refills | Status: DC

## 2018-01-01 MED ORDER — ALBUTEROL SULFATE HFA 108 (90 BASE) MCG/ACT IN AERS: 2.0000 | INHALATION_SPRAY | Freq: Four times a day (QID) | RESPIRATORY_TRACT | 12 refills | Status: DC | PRN

## 2018-01-01 MED ORDER — PREDNISONE 10 MG (21) PO TBPK: ORAL_TABLET | ORAL | 0 refills | Status: DC

## 2018-01-01 MED ORDER — OMEPRAZOLE 40 MG PO CPDR: 40.0000 mg | DELAYED_RELEASE_CAPSULE | Freq: Every day | ORAL | 3 refills | Status: DC

## 2018-01-01 NOTE — ED Triage Notes (Signed)
Pt c/o cough with pharyngitis type sx, raspy voice since October, states she has been on prednisone and abx but it never goes away.

## 2018-01-01 NOTE — Discharge Instructions (Signed)
Follow-up with your regular doctor as needed.  Follow-up with ear nose and throat, Dr. Willeen CassBennett, schedule an appointment for the laryngitis.  For pulmonology and would call about our pulmonologist, the phone numbers provided.  Take the medication as prescribed.  Stop smoking.  Return to the emergency department if worsening

## 2018-01-01 NOTE — ED Provider Notes (Signed)
[facility] Emergency Department Provider Note  ____________________________________________   First MD Initiated Contact with Patient 01/01/18 1916     (approximate)  I have reviewed the triage vital signs and the nursing notes.   HISTORY  Chief Complaint Cough    HPI Penny Tucker is a 37 y.o. female presents emergency department complaining of cough and sore throat on and off since October.  She states she has had Levaquin and Z-Pak without any relief.  Prednisone helped a little bit but always comes back.  She said the laryngitis is been constant.  She denies any fever or chills.  She denies chest pain or shortness of breath.  She is out of her inhaler and having to use the nebulizers more often.  She states she was recently fired by her primary care provider due to too many missed appointments.  Past Medical History:  Diagnosis Date  . Anxiety   . Asthma   . H/O drug abuse   . Hep C w/o coma, chronic (HCC)   . Myopia of both eyes   . Preeclampsia   . Reactive airway disease   . Ruptured lumbar disc 2008  . Thyrotoxicosis     Patient Active Problem List   Diagnosis Date Noted  . Hepatitis C 10/07/2017  . Hepatitis C antibody test positive 10/02/2017  . Productive cough 10/02/2017  . Poor concentration 10/02/2017  . Healthcare maintenance 08/26/2017  . Family history of breast cancer 07/25/2017  . Lumbar disc herniation 07/25/2017  . Neck pain 07/25/2017  . Depression 07/25/2017  . Reactive airway disease   . Anxiety   . H/O drug abuse   . Tobacco abuse 01/02/2017  . Subclinical hyperthyroidism 07/22/2016  . Polysubstance abuse (HCC) 07/22/2016  . Opioid withdrawal (HCC) 07/22/2016    Past Surgical History:  Procedure Laterality Date  . CERVIX LESION DESTRUCTION  1998  . CESAREAN SECTION  2007, 2018   x2  . 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 INCISION AND DRAINAGE ABSCESS Right 10/02/2015   Procedure:  INCISION AND DRAINAGE ABSCESS;  Surgeon: Christena FlakeJohn J Poggi, MD;  Location: [facility] ORS;  Service: Orthopedics;  Laterality: Right;  . LAPAROSCOPIC CHOLECYSTECTOMY  2010    Prior to Admission medications   Medication Sig Start Date End Date Taking? Authorizing Provider  albuterol (PROVENTIL HFA;VENTOLIN HFA) 108 (90 Base) MCG/ACT inhaler Inhale 2 puffs into the lungs every 6 (six) hours as needed for wheezing or shortness of breath. 01/01/18   Sherrie MustacheFisher, Roselyn BeringSusan W, PA-C  beclomethasone (QVAR) 80 MCG/ACT inhaler Inhale 2 puffs into the lungs 2 (two) times daily. 01/01/18   Fisher, Roselyn BeringSusan W, PA-C  cefdinir (OMNICEF) 300 MG capsule Take 1 capsule (300 mg total) by mouth 2 (two) times daily. 01/01/18   Fisher, Roselyn BeringSusan W, PA-C  citalopram (CELEXA) 40 MG tablet Take 1 tablet (40 mg total) by mouth daily. 10/01/17   Jomarie LongsEappen, Saramma, MD  etonogestrel (NEXPLANON) 68 MG IMPL implant 1 each by Subdermal route once. 12/31/16   [provider]  hydrOXYzine (VISTARIL) 25 MG capsule Take 1 capsule (25 mg total) by mouth every 6 (six) hours as needed for anxiety. 10/01/17   Jomarie LongsEappen, Saramma, MD  ibuprofen (ADVIL,MOTRIN) 200 MG tablet Take 600 mg by mouth every 6 (six) hours as needed.    [provider]  omeprazole (PRILOSEC) 40 MG capsule Take 1 capsule (40 mg total) by mouth daily. 01/01/18   Greig RightFisher, Susan  W, PA-C  predniSONE (STERAPRED UNI-PAK 21 TAB) 10 MG (21) TBPK tablet Take 6 pills on day one then decrease by 1 pill each day 01/01/18   Faythe Ghee, PA-C  SUBOXONE 2-0.5 MG FILM DISSOVLE ONE FILM UNDER THE TONGUE EVERY 12 HOURS 11/26/17   [provider]    Allergies Patient has no known allergies.  Family History  Problem Relation Age of Onset  . Clotting disorder Mother        factor V leiden  . Deep vein thrombosis Mother   . Depression Mother   . Liver disease Father        cirrhosis, hep C  . Alcohol abuse Father   . Drug abuse Father   . Healthy Brother   . Healthy Brother   .  Breast cancer Maternal Grandmother   . Depression Maternal Grandmother   . Breast cancer Maternal Aunt 40  . Depression Maternal Aunt   . Bipolar disorder Maternal Aunt   . Schizophrenia Maternal Aunt   . Breast cancer Cousin 40  . Colon cancer Neg Hx     Social History Social History   Tobacco Use  . Smoking status: Current Every Day Smoker    Packs/day: 0.75    Years: 20.00    Pack years: 15.00    Types: Cigarettes  . Smokeless tobacco: Never Used  . Tobacco comment: starting smoking at age 75  Substance Use Topics  . Alcohol use: No  . Drug use: No    Review of Systems  Constitutional: No fever/chills Eyes: No visual changes. ENT: Stiff sore throat. Respiratory: Positive cough Genitourinary: Negative for dysuria. Musculoskeletal: Negative for back pain. Skin: Negative for rash.    ____________________________________________   PHYSICAL EXAM:  VITAL SIGNS: ED Triage Vitals  Enc Vitals Group     BP 01/01/18 1841 124/76     Pulse Rate 01/01/18 1841 (!) 101     Resp 01/01/18 1841 18     Temp 01/01/18 1841 97.6 F (36.4 C)     Temp Source 01/01/18 1841 Oral     SpO2 01/01/18 1841 97 %     Weight 01/01/18 1842 239 lb (108.4 kg)     Height 01/01/18 1842 5\' 9"  (1.753 m)     Head Circumference --      Peak Flow --      Pain Score 01/01/18 1841 8     Pain Loc --      Pain Edu? --      Excl. in GC? --     Constitutional: Alert and oriented. Well appearing and in no acute distress. Eyes: Conjunctivae are normal.  Head: Atraumatic. Nose: No congestion/rhinnorhea. Mouth/Throat: Mucous membranes are moist.  Voice is hoarse Neck: Is supple, no lymphadenopathy is noted Cardiovascular: Normal rate, regular rhythm.  Heart sounds are normal Respiratory: Normal respiratory effort.  No retractions, lungs clear to auscultation, cough is dry and hacking GU: deferred Musculoskeletal: FROM all extremities, warm and well perfused Neurologic:  Normal speech and  language.  Skin:  Skin is warm, dry and intact. No rash noted. Psychiatric: Mood and affect are normal. Speech and behavior are normal.  ____________________________________________   LABS (all labs ordered are listed, but only abnormal results are displayed)  Labs Reviewed - No data to display ____________________________________________   ____________________________________________  RADIOLOGY  Chest x-ray shows bronchitic changes.  No pneumonia  ____________________________________________   PROCEDURES  Procedure(s) performed: No  Procedures    ____________________________________________   INITIAL IMPRESSION /  ASSESSMENT AND PLAN / ED COURSE  Pertinent labs & imaging results that were available during my care of the patient were reviewed by me and considered in my medical decision making (see chart for details).  Patient is a 37 year old female complaining of chronic cough and pharyngitis since October.  She is a smoker.  She has had Levaquin and a Z-Pak without any relief.  States the Levaquin did help in the left Z-Pak did not at all.  Since then she has been coughing up yellow and green mucus.  On physical exam she appears basically well.  She has a hoarse voice and had dry hacking cough.  Remainder the exam is benign  Chest x-ray is negative for pneumonia, has chronic bronchitic changes  Chest x-ray results were discussed with patient.  Chronic hoarseness was discussed with the patient.  She really needs to see a ear nose and throat specialist.  She states she understands.  She was given a prescription for Omnicef 300 mg twice daily, Sterapred 10 mg six-day Dosepak, omeprazole 40 mg daily, and refills on her inhalers.  She is to follow-up with ENT and pulmonology.  She is to call and make appointments for these.  She states she understands.  She is to stop smoking.  She states she understands.  She was discharged in stable condition     As part of my medical  decision making, I reviewed the following data within the electronic MEDICAL RECORD NUMBER Nursing notes reviewed and incorporated, Old chart reviewed, Radiograph reviewed chest x-rays negative for pneumonia, Notes from prior ED visits and [place] Controlled Substance Database  ____________________________________________   FINAL CLINICAL IMPRESSION(S) / ED DIAGNOSES  Final diagnoses:  Acute asthmatic bronchitis  Laryngitis      NEW MEDICATIONS STARTED DURING THIS VISIT:  New Prescriptions   CEFDINIR (OMNICEF) 300 MG CAPSULE    Take 1 capsule (300 mg total) by mouth 2 (two) times daily.   OMEPRAZOLE (PRILOSEC) 40 MG CAPSULE    Take 1 capsule (40 mg total) by mouth daily.   PREDNISONE (STERAPRED UNI-PAK 21 TAB) 10 MG (21) TBPK TABLET    Take 6 pills on day one then decrease by 1 pill each day     Note:  This document was prepared using Dragon voice recognition software and may include unintentional dictation errors.    Faythe Ghee, PA-C 01/01/18 1951    Willy Eddy, MD 01/01/18 2015

## 2018-01-02 ENCOUNTER — Ambulatory Visit: Payer: Self-pay | Admitting: Family Medicine

## 2018-07-18 ENCOUNTER — Emergency Department
Admission: EM | Admit: 2018-07-18 | Discharge: 2018-07-18 | Disposition: A | Discharge status: Home / Self Care | Payer: Medicaid Other | Attending: Emergency Medicine | Admitting: Emergency Medicine

## 2018-07-18 ENCOUNTER — Encounter: Payer: Self-pay | Admitting: *Deleted

## 2018-07-18 ENCOUNTER — Emergency Department: Payer: Medicaid Other

## 2018-07-18 ENCOUNTER — Other Ambulatory Visit: Payer: Self-pay

## 2018-07-18 DIAGNOSIS — W540XXA Bitten by dog, initial encounter: Secondary | ICD-10-CM | POA: Insufficient documentation

## 2018-07-18 DIAGNOSIS — E0590 Thyrotoxicosis, unspecified without thyrotoxic crisis or storm: Secondary | ICD-10-CM | POA: Insufficient documentation

## 2018-07-18 DIAGNOSIS — J45909 Unspecified asthma, uncomplicated: Secondary | ICD-10-CM | POA: Diagnosis not present

## 2018-07-18 DIAGNOSIS — Y999 Unspecified external cause status: Secondary | ICD-10-CM | POA: Insufficient documentation

## 2018-07-18 DIAGNOSIS — Y929 Unspecified place or not applicable: Secondary | ICD-10-CM | POA: Diagnosis not present

## 2018-07-18 DIAGNOSIS — F17210 Nicotine dependence, cigarettes, uncomplicated: Secondary | ICD-10-CM | POA: Insufficient documentation

## 2018-07-18 DIAGNOSIS — S59802A Other specified injuries of left elbow, initial encounter: Secondary | ICD-10-CM | POA: Diagnosis present

## 2018-07-18 DIAGNOSIS — Z79899 Other long term (current) drug therapy: Secondary | ICD-10-CM | POA: Insufficient documentation

## 2018-07-18 DIAGNOSIS — Y9389 Activity, other specified: Secondary | ICD-10-CM | POA: Diagnosis not present

## 2018-07-18 MED ORDER — HYDROCODONE-ACETAMINOPHEN 5-325 MG PO TABS
1.0000 | ORAL_TABLET | Freq: Once | ORAL | Status: AC
  Administered 2018-07-18: 1 via ORAL
  Filled 2018-07-18: qty 1

## 2018-07-18 MED ORDER — AMOXICILLIN-POT CLAVULANATE 875-125 MG PO TABS: 1.0000 | ORAL_TABLET | Freq: Two times a day (BID) | ORAL | 0 refills | Status: AC

## 2018-07-18 MED ORDER — AMOXICILLIN-POT CLAVULANATE 875-125 MG PO TABS
1.0000 | ORAL_TABLET | Freq: Once | ORAL | Status: AC
  Administered 2018-07-18: 1 via ORAL
  Filled 2018-07-18: qty 1
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
## 2018-07-18 NOTE — Discharge Instructions (Signed)
Please take your antibiotic as prescribed.  Keep the area covered with a bandage until completely healed.  Return to the emergency department for any fever, signs of worsening infection such as increased redness pain or pus.  Please follow-up with your primary care doctor in the next 2 to 3 days for recheck/reevaluation.
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
## 2018-07-18 NOTE — ED Notes (Signed)
Mom stating they need to go because she has appt with housing. MD notified.

## 2018-12-02 ENCOUNTER — Encounter: Payer: Self-pay | Admitting: Emergency Medicine

## 2018-12-02 ENCOUNTER — Emergency Department
Admission: EM | Admit: 2018-12-02 | Discharge: 2018-12-02 | Disposition: A | Discharge status: Home / Self Care | Payer: Medicaid Other | Attending: Student in an Organized Health Care Education/Training Program | Admitting: Student in an Organized Health Care Education/Training Program

## 2018-12-02 DIAGNOSIS — Z5321 Procedure and treatment not carried out due to patient leaving prior to being seen by health care provider: Secondary | ICD-10-CM | POA: Insufficient documentation

## 2018-12-02 DIAGNOSIS — G8929 Other chronic pain: Secondary | ICD-10-CM

## 2018-12-02 DIAGNOSIS — M545 Low back pain, unspecified: Secondary | ICD-10-CM

## 2018-12-02 LAB — URINE DRUG SCREEN, QUALITATIVE (ARMC ONLY)
Amphetamines, Ur Screen: NOT DETECTED
BARBITURATES, UR SCREEN: NOT DETECTED
BENZODIAZEPINE, UR SCRN: NOT DETECTED
Cannabinoid 50 Ng, Ur ~~LOC~~: NOT DETECTED
Cocaine Metabolite,Ur ~~LOC~~: NOT DETECTED
MDMA (Ecstasy)Ur Screen: NOT DETECTED
METHADONE SCREEN, URINE: NOT DETECTED
OPIATE, UR SCREEN: POSITIVE — AB
PHENCYCLIDINE (PCP) UR S: NOT DETECTED
Tricyclic, Ur Screen: NOT DETECTED

## 2018-12-02 NOTE — ED Notes (Signed)
Pt went to restroom

## 2018-12-02 NOTE — ED Notes (Signed)
Pt has made two phone calls about transportation.

## 2018-12-02 NOTE — Discharge Instructions (Addendum)
Patient has a history of chronic back pain but does not exhibit any new back pain.  Therefore I do not see any problem with her going to [facility].

## 2018-12-02 NOTE — ED Notes (Signed)
Pt. Talking to [facility] (Tiffany) on portable phone. Awaiting urine sample.

## 2018-12-02 NOTE — ED Triage Notes (Signed)
Pt arrived to the ED to be medically cleared to go to [facility]. Pt states that she wants to do detox for heroin and the last time that she was in [facility] they told her that she needed to be medically cleared for her back problems before she could be seen in [facility]. Pt is AOx4 in no apparent distress.

## 2018-12-04 ENCOUNTER — Emergency Department
Admission: EM | Admit: 2018-12-04 | Discharge: 2018-12-04 | Disposition: A | Discharge status: Home / Self Care | Payer: Self-pay | Attending: Emergency Medicine | Admitting: Emergency Medicine

## 2018-12-04 ENCOUNTER — Other Ambulatory Visit: Payer: Self-pay

## 2018-12-04 DIAGNOSIS — F1193 Opioid use, unspecified with withdrawal: Secondary | ICD-10-CM

## 2018-12-04 DIAGNOSIS — F1123 Opioid dependence with withdrawal: Secondary | ICD-10-CM | POA: Insufficient documentation

## 2018-12-04 DIAGNOSIS — J45909 Unspecified asthma, uncomplicated: Secondary | ICD-10-CM | POA: Insufficient documentation

## 2018-12-04 DIAGNOSIS — F17210 Nicotine dependence, cigarettes, uncomplicated: Secondary | ICD-10-CM | POA: Insufficient documentation

## 2018-12-04 DIAGNOSIS — E860 Dehydration: Secondary | ICD-10-CM | POA: Insufficient documentation

## 2018-12-04 LAB — URINALYSIS, COMPLETE (UACMP) WITH MICROSCOPIC
Bilirubin Urine: NEGATIVE
Glucose, UA: NEGATIVE mg/dL
Ketones, ur: 5 mg/dL — AB
Leukocytes, UA: NEGATIVE
Nitrite: NEGATIVE
PROTEIN: NEGATIVE mg/dL
Specific Gravity, Urine: 1.005 (ref 1.005–1.030)
pH: 8 (ref 5.0–8.0)

## 2018-12-04 LAB — CBC WITH DIFFERENTIAL/PLATELET
ABS IMMATURE GRANULOCYTES: 0.03 10*3/uL (ref 0.00–0.07)
BASOS PCT: 0 %
Basophils Absolute: 0 10*3/uL (ref 0.0–0.1)
EOS ABS: 0.1 10*3/uL (ref 0.0–0.5)
EOS PCT: 2 %
HEMATOCRIT: 45.4 % (ref 36.0–46.0)
HEMOGLOBIN: 15.6 g/dL — AB (ref 12.0–15.0)
Immature Granulocytes: 0 %
Lymphocytes Relative: 21 %
Lymphs Abs: 1.6 10*3/uL (ref 0.7–4.0)
MCH: 28.9 pg (ref 26.0–34.0)
MCHC: 34.4 g/dL (ref 30.0–36.0)
MCV: 84.1 fL (ref 80.0–100.0)
MONO ABS: 0.4 10*3/uL (ref 0.1–1.0)
MONOS PCT: 5 %
Neutro Abs: 5.4 10*3/uL (ref 1.7–7.7)
Neutrophils Relative %: 72 %
Platelets: 474 10*3/uL — ABNORMAL HIGH (ref 150–400)
RBC: 5.4 MIL/uL — AB (ref 3.87–5.11)
RDW: 13.7 % (ref 11.5–15.5)
Smear Review: NORMAL
WBC: 7.6 10*3/uL (ref 4.0–10.5)
nRBC: 0 % (ref 0.0–0.2)

## 2018-12-04 LAB — BASIC METABOLIC PANEL
Anion gap: 6 (ref 5–15)
BUN: 9 mg/dL (ref 6–20)
CALCIUM: 8.8 mg/dL — AB (ref 8.9–10.3)
CO2: 26 mmol/L (ref 22–32)
CREATININE: 0.68 mg/dL (ref 0.44–1.00)
Chloride: 105 mmol/L (ref 98–111)
GFR calc Af Amer: >60 mL/min (ref >60)
Glucose, Bld: 143 mg/dL — ABNORMAL HIGH (ref 70–99)
POTASSIUM: 3.9 mmol/L (ref 3.5–5.1)
SODIUM: 137 mmol/L (ref 135–145)

## 2018-12-04 MED ORDER — SODIUM CHLORIDE 0.9 % IV BOLUS
1000.0000 mL | Freq: Once | INTRAVENOUS | Status: AC
  Administered 2018-12-04: 1000 mL via INTRAVENOUS

## 2018-12-04 MED ORDER — KETOROLAC TROMETHAMINE 30 MG/ML IJ SOLN
15.0000 mg | Freq: Once | INTRAMUSCULAR | Status: AC
  Administered 2018-12-04: 15 mg via INTRAVENOUS
  Filled 2018-12-04: qty 1

## 2018-12-04 MED ORDER — ONDANSETRON 4 MG PO TBDP: 4.0000 mg | ORAL_TABLET | Freq: Three times a day (TID) | ORAL | 0 refills | Status: AC | PRN

## 2018-12-04 MED ORDER — CLONIDINE HCL 0.1 MG PO TABS
0.1000 mg | ORAL_TABLET | Freq: Once | ORAL | Status: AC
  Administered 2018-12-04: 0.1 mg via ORAL
  Filled 2018-12-04: qty 1

## 2018-12-04 NOTE — ED Provider Notes (Signed)
[facility] Emergency Department Provider Note  ____________________________________________  Time seen: Approximately 7:19 PM  I have reviewed the triage vital signs and the nursing notes.   HISTORY  Chief Complaint Fall    HPI Barbette Reichmannonya M Sereno is a 38 y.o. female with a history of asthma, hepatitis C, heroin abuse and dependence who was sent to the ED from residential treatment services due to multiple falls.  She complains of chronic back pain but also reports pain all over her entire body.  She reports nausea without vomiting or diarrhea.  Denies any head injury weakness or vision changes.  No neck pain.  Reports that she has not been eating or drinking for the past 2 days while at [facility] due to feeling very bad.      Past Medical History:  Diagnosis Date  . Anxiety   . Asthma   . H/O drug abuse (HCC)   . Hep C w/o coma, chronic (HCC)   . Myopia of both eyes   . Preeclampsia   . Reactive airway disease   . Ruptured lumbar disc 2008  . Thyrotoxicosis      Patient Active Problem List   Diagnosis Date Noted  . Hepatitis C 10/07/2017  . Hepatitis C antibody test positive 10/02/2017  . Productive cough 10/02/2017  . Poor concentration 10/02/2017  . Healthcare maintenance 08/26/2017  . Family history of breast cancer 07/25/2017  . Lumbar disc herniation 07/25/2017  . Neck pain 07/25/2017  . Depression 07/25/2017  . Reactive airway disease   . Anxiety   . H/O drug abuse (HCC)   . Tobacco abuse 01/02/2017  . Subclinical hyperthyroidism 07/22/2016  . Polysubstance abuse (HCC) 07/22/2016  . Opioid withdrawal (HCC) 07/22/2016     Past Surgical History:  Procedure Laterality Date  . CERVIX LESION DESTRUCTION  1998  . CESAREAN SECTION  2007, 2018   x2  . 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 INCISION AND DRAINAGE ABSCESS Right 10/02/2015   Procedure: INCISION AND DRAINAGE ABSCESS;  Surgeon: Christena FlakeJohn J Poggi, MD;  Location:  [facility] ORS;  Service: Orthopedics;  Laterality: Right;  . LAPAROSCOPIC CHOLECYSTECTOMY  2010     Prior to Admission medications   Medication Sig Start Date End Date Taking? Authorizing Provider  acetaminophen (TYLENOL) 650 MG CR tablet Take 650 mg by mouth every 6 (six) hours as needed for pain.   Yes [provider]  gabapentin (NEURONTIN) 300 MG capsule Take 300 mg by mouth every 8 (eight) hours as needed (chronic nerve pain).   Yes [provider]  guaifenesin (HUMIBID E) 400 MG TABS tablet Take 400 mg by mouth every 4 (four) hours as needed (cough/congestion). Not to exceed 6 in 24 hours for 3 days   Yes [provider]  hydrOXYzine (VISTARIL) 25 MG capsule Take 1 capsule (25 mg total) by mouth every 6 (six) hours as needed for anxiety. Patient taking differently: Take 25-50 mg by mouth every 6 (six) hours as needed for anxiety. (Do not combine with trazodone) 10/01/17  Yes Eappen, Levin BaconSaramma, MD  ibuprofen (ADVIL,MOTRIN) 400 MG tablet Take 400 mg by mouth every 6 (six) hours as needed (severe pain). Take with tylenol 650   Yes [provider]  LORazepam (ATIVAN) 1 MG tablet Take 1-2 mg by mouth every 4 (four) hours as needed for anxiety (withdrawal symptoms).   Yes [provider]  methocarbamol (ROBAXIN) 750 MG tablet Take 500-750 mg by  mouth every 8 (eight) hours as needed for muscle spasms.   Yes [provider]  naproxen sodium (ALEVE) 220 MG tablet Take 220 mg by mouth every 8 (eight) hours as needed (pain).   Yes [provider]  traZODone (DESYREL) 50 MG tablet Take 50-100 mg by mouth at bedtime as needed for sleep.   Yes [provider]  ondansetron (ZOFRAN ODT) 4 MG disintegrating tablet Take 1 tablet (4 mg total) by mouth every 8 (eight) hours as needed for nausea or vomiting. 12/04/18   Sharman CheekStafford, Daiel Strohecker, MD     Allergies Patient has no known allergies.   Family History  Problem Relation Age of Onset  .  Clotting disorder Mother        factor V leiden  . Deep vein thrombosis Mother   . Depression Mother   . Liver disease Father        cirrhosis, hep C  . Alcohol abuse Father   . Drug abuse Father   . Healthy Brother   . Healthy Brother   . Breast cancer Maternal Grandmother   . Depression Maternal Grandmother   . Breast cancer Maternal Aunt 40  . Depression Maternal Aunt   . Bipolar disorder Maternal Aunt   . Schizophrenia Maternal Aunt   . Breast cancer Cousin 40  . Colon cancer Neg Hx     Social History Social History   Tobacco Use  . Smoking status: Current Every Day Smoker    Packs/day: 0.75    Years: 20.00    Pack years: 15.00    Types: Cigarettes  . Smokeless tobacco: Never Used  . Tobacco comment: starting smoking at age 38  Substance Use Topics  . Alcohol use: No  . Drug use: No    Review of Systems  Constitutional:   No fever or chills.  ENT:   No sore throat. No rhinorrhea. Cardiovascular:   No chest pain or syncope. Respiratory:   No dyspnea or cough. Gastrointestinal:   Negative for abdominal pain, vomiting and diarrhea.  Musculoskeletal:   Negative for focal pain or swelling All other systems reviewed and are negative except as documented above in ROS and HPI.  ____________________________________________   PHYSICAL EXAM:  VITAL SIGNS: ED Triage Vitals  Enc Vitals Group     BP 12/04/18 1706 (!) 137/100     Pulse Rate 12/04/18 1706 79     Resp 12/04/18 1706 14     Temp 12/04/18 1706 98.1 F (36.7 C)     Temp src --      SpO2 12/04/18 1706 97 %     Weight 12/04/18 1705 191 lb (86.6 kg)     Height 12/04/18 1705 5\' 7"  (1.702 m)     Head Circumference --      Peak Flow --      Pain Score 12/04/18 1707 8     Pain Loc --      Pain Edu? --      Excl. in GC? --     Vital signs reviewed, nursing assessments reviewed.   Constitutional:   Alert and oriented. Non-toxic appearance. Eyes:   Conjunctivae are normal. EOMI. PERRL. ENT      Head:    Normocephalic and atraumatic.      Nose:   No congestion/rhinnorhea.       Mouth/Throat:   Dry mucous membranes, no pharyngeal erythema. No peritonsillar mass.       Neck:   No meningismus. Full ROM. Hematological/Lymphatic/Immunilogical:  No cervical lymphadenopathy. Cardiovascular:   RRR. Symmetric bilateral radial and DP pulses.  No murmurs. Cap refill less than 2 seconds. Respiratory:   Normal respiratory effort without tachypnea/retractions. Breath sounds are clear and equal bilaterally. No wheezes/rales/rhonchi. Gastrointestinal:   Soft and nontender. Non distended. There is no CVA tenderness.  No rebound, rigidity, or guarding. Musculoskeletal:   Normal range of motion in all extremities. No joint effusions.  No lower extremity tenderness.  No edema. Neurologic:   Normal speech and language.  Motor grossly intact. No acute focal neurologic deficits are appreciated.  Skin:    Skin is warm, dry and intact. No rash noted.  No petechiae, purpura, or bullae.  ____________________________________________    LABS (pertinent positives/negatives) (all labs ordered are listed, but only abnormal results are displayed) Labs Reviewed  BASIC METABOLIC PANEL - Abnormal; Notable for the following components:      Result Value   Glucose, Bld 143 (*)    Calcium 8.8 (*)    All other components within normal limits  CBC WITH DIFFERENTIAL/PLATELET - Abnormal; Notable for the following components:   RBC 5.40 (*)    Hemoglobin 15.6 (*)    Platelets 474 (*)    All other components within normal limits  URINALYSIS, COMPLETE (UACMP) WITH MICROSCOPIC - Abnormal; Notable for the following components:   Color, Urine YELLOW (*)    APPearance CLEAR (*)    Hgb urine dipstick SMALL (*)    Ketones, ur 5 (*)    Bacteria, UA RARE (*)    All other components within normal limits   ____________________________________________   EKG    ____________________________________________     RADIOLOGY  No results found.  ____________________________________________   PROCEDURES Procedures  ____________________________________________  CLINICAL IMPRESSION / ASSESSMENT AND PLAN / ED COURSE  Pertinent labs & imaging results that were available during my care of the patient were reviewed by me and considered in my medical decision making (see chart for details).    Patient presents with generalized body aches and nausea as well as clinically mild dehydration.  Think her symptoms are all consistent with opiate withdrawal, dehydration, and clonidine effect.  Vital signs are normal in the ED.  Will attempt to control her symptoms while checking labs and getting IV fluids for hydration.  Clinical Course as of Dec 04 1917  Thu Dec 04, 2018  1610 Patient reports ongoing generalized body aches.  When advised that we will not be giving her opioids for the pain, patient request to be discharged.  She is medically stable and I will discharge her back to [facility] to continue her detox/rehab program.   [PS]    Clinical Course User Index [PS] Sharman Cheek, MD     ____________________________________________   FINAL CLINICAL IMPRESSION(S) / ED DIAGNOSES    Final diagnoses:  Opiate withdrawal (HCC)  Mild dehydration     ED Discharge Orders         Ordered    ondansetron (ZOFRAN ODT) 4 MG disintegrating tablet  Every 8 hours PRN     12/04/18 1919          Portions of this note were generated with dragon dictation software. Dictation errors may occur despite best attempts at proofreading.   Sharman Cheek, MD 12/04/18 1921

## 2018-12-04 NOTE — Discharge Instructions (Signed)
Continue your detox program and take all your medications as prescribed.  Be sure to stay well-hydrated.  Zofran may be helpful for controlling nausea.

## 2018-12-04 NOTE — ED Notes (Signed)
First nurse Clinton Sawyer aware that pt waiting in lobby for [facility] to pick her up. Pt in NAD at time of D/C.

## 2018-12-04 NOTE — ED Notes (Signed)
Pt refusing to take orthstatic vital signs at this time. Pt requesting pain medication. MD aware. MD Scotty Court offered pt tylenol to take and pt replied "That ain't gonna do shit for me, I'm ready to go, just print my papers so I can go back to the clinic". MD aware.

## 2018-12-04 NOTE — ED Triage Notes (Addendum)
Pt arrived via [facility] from [facility] where she was admitted Tues. Pt is detoxing from heroin use. Pt was started on Clonidiine and Ativan. Pt had fallen several times with chronic back pain and right knee pain. Pt NAD at present.
